# Patient Record
Sex: Male | Born: 1980 | Race: White | Hispanic: No | Marital: Single | Smoking: Former smoker
Health system: Southern US, Community
[De-identification: ages and names within clinical notes are randomized; demographics above are authoritative.]

## PROBLEM LIST (undated history)

## (undated) DIAGNOSIS — E785 Hyperlipidemia, unspecified: Secondary | ICD-10-CM

## (undated) DIAGNOSIS — R7303 Prediabetes: Secondary | ICD-10-CM

## (undated) HISTORY — DX: Hyperlipidemia, unspecified: E78.5

## (undated) HISTORY — PX: TONSILLECTOMY: SUR1361

## (undated) HISTORY — PX: APPENDECTOMY: SHX54

## (undated) HISTORY — DX: Prediabetes: R73.03

---

## 2002-07-23 ENCOUNTER — Ambulatory Visit (HOSPITAL_COMMUNITY): Admission: RE | Admit: 2002-07-23 | Discharge: 2002-07-23 | Payer: Self-pay | Admitting: Pulmonary Disease

## 2002-07-30 ENCOUNTER — Encounter (HOSPITAL_COMMUNITY): Admission: RE | Admit: 2002-07-30 | Discharge: 2002-08-29 | Payer: Self-pay | Admitting: Pulmonary Disease

## 2002-09-26 ENCOUNTER — Emergency Department (HOSPITAL_COMMUNITY): Admission: EM | Admit: 2002-09-26 | Discharge: 2002-09-26 | Payer: Self-pay | Admitting: Emergency Medicine

## 2003-09-14 ENCOUNTER — Observation Stay (HOSPITAL_COMMUNITY): Admission: EM | Admit: 2003-09-14 | Discharge: 2003-09-15 | Payer: Self-pay | Admitting: Emergency Medicine

## 2004-08-18 ENCOUNTER — Emergency Department (HOSPITAL_COMMUNITY): Admission: EM | Admit: 2004-08-18 | Discharge: 2004-08-18 | Payer: Self-pay | Admitting: Emergency Medicine

## 2005-10-21 ENCOUNTER — Ambulatory Visit: Payer: Self-pay | Admitting: Family Medicine

## 2005-12-03 ENCOUNTER — Encounter (INDEPENDENT_AMBULATORY_CARE_PROVIDER_SITE_OTHER): Payer: Self-pay | Admitting: Family Medicine

## 2006-06-02 ENCOUNTER — Ambulatory Visit: Payer: Self-pay | Admitting: Family Medicine

## 2007-02-22 ENCOUNTER — Ambulatory Visit: Payer: Self-pay | Admitting: Family Medicine

## 2008-09-20 ENCOUNTER — Ambulatory Visit: Payer: Self-pay | Admitting: Family Medicine

## 2008-09-20 DIAGNOSIS — M25519 Pain in unspecified shoulder: Secondary | ICD-10-CM | POA: Insufficient documentation

## 2008-09-20 DIAGNOSIS — E663 Overweight: Secondary | ICD-10-CM | POA: Insufficient documentation

## 2008-09-20 DIAGNOSIS — F17200 Nicotine dependence, unspecified, uncomplicated: Secondary | ICD-10-CM | POA: Insufficient documentation

## 2008-09-21 ENCOUNTER — Encounter (INDEPENDENT_AMBULATORY_CARE_PROVIDER_SITE_OTHER): Payer: Self-pay | Admitting: Family Medicine

## 2008-09-23 ENCOUNTER — Encounter (INDEPENDENT_AMBULATORY_CARE_PROVIDER_SITE_OTHER): Payer: Self-pay | Admitting: Family Medicine

## 2008-09-23 LAB — CONVERTED CEMR LAB
Cholesterol: 186 mg/dL (ref 0–200)
Glucose, Bld: 80 mg/dL (ref 70–99)

## 2008-09-30 ENCOUNTER — Encounter (INDEPENDENT_AMBULATORY_CARE_PROVIDER_SITE_OTHER): Payer: Self-pay | Admitting: Family Medicine

## 2009-03-13 ENCOUNTER — Ambulatory Visit: Payer: Self-pay | Admitting: Family Medicine

## 2009-03-13 DIAGNOSIS — I498 Other specified cardiac arrhythmias: Secondary | ICD-10-CM | POA: Insufficient documentation

## 2009-03-13 DIAGNOSIS — R609 Edema, unspecified: Secondary | ICD-10-CM | POA: Insufficient documentation

## 2009-03-13 DIAGNOSIS — R42 Dizziness and giddiness: Secondary | ICD-10-CM | POA: Insufficient documentation

## 2009-03-14 ENCOUNTER — Encounter (INDEPENDENT_AMBULATORY_CARE_PROVIDER_SITE_OTHER): Payer: Self-pay | Admitting: Family Medicine

## 2009-03-14 ENCOUNTER — Telehealth (INDEPENDENT_AMBULATORY_CARE_PROVIDER_SITE_OTHER): Payer: Self-pay | Admitting: *Deleted

## 2009-03-14 ENCOUNTER — Ambulatory Visit: Payer: Self-pay | Admitting: Cardiology

## 2009-03-14 ENCOUNTER — Ambulatory Visit (HOSPITAL_COMMUNITY): Admission: RE | Admit: 2009-03-14 | Discharge: 2009-03-14 | Payer: Self-pay | Admitting: Family Medicine

## 2009-03-14 LAB — CONVERTED CEMR LAB
ALT: 53 units/L (ref 0–53)
AST: 35 units/L (ref 0–37)
Albumin: 5 g/dL (ref 3.5–5.2)
Alkaline Phosphatase: 70 units/L (ref 39–117)
BUN: 19 mg/dL (ref 6–23)
Basophils Absolute: 0 10*3/uL (ref 0.0–0.1)
Basophils Relative: 0 % (ref 0–1)
CO2: 27 meq/L (ref 19–32)
Calcium: 10.5 mg/dL (ref 8.4–10.5)
Chloride: 101 meq/L (ref 96–112)
Creatinine, Ser: 1.12 mg/dL (ref 0.40–1.50)
Eosinophils Absolute: 0.2 10*3/uL (ref 0.0–0.7)
Eosinophils Relative: 2 % (ref 0–5)
Glucose, Bld: 148 mg/dL — ABNORMAL HIGH (ref 70–99)
HCT: 47.3 % (ref 39.0–52.0)
Hemoglobin: 16.6 g/dL (ref 13.0–17.0)
Lymphocytes Relative: 28 % (ref 12–46)
Lymphs Abs: 2.3 10*3/uL (ref 0.7–4.0)
MCHC: 35.1 g/dL (ref 30.0–36.0)
MCV: 89.9 fL (ref 78.0–100.0)
Monocytes Absolute: 0.5 10*3/uL (ref 0.1–1.0)
Monocytes Relative: 6 % (ref 3–12)
Neutro Abs: 5.3 10*3/uL (ref 1.7–7.7)
Neutrophils Relative %: 64 % (ref 43–77)
Platelets: 167 10*3/uL (ref 150–400)
Potassium: 5 meq/L (ref 3.5–5.3)
RBC: 5.26 M/uL (ref 4.22–5.81)
RDW: 12.8 % (ref 11.5–15.5)
Sodium: 140 meq/L (ref 135–145)
TSH: 0.927 microintl units/mL (ref 0.350–4.500)
Total Bilirubin: 0.6 mg/dL (ref 0.3–1.2)
Total Protein: 8.2 g/dL (ref 6.0–8.3)
WBC: 8.3 10*3/uL (ref 4.0–10.5)

## 2009-04-11 ENCOUNTER — Ambulatory Visit: Payer: Self-pay | Admitting: Family Medicine

## 2009-05-03 ENCOUNTER — Encounter (INDEPENDENT_AMBULATORY_CARE_PROVIDER_SITE_OTHER): Payer: Self-pay | Admitting: Family Medicine

## 2010-10-02 ENCOUNTER — Ambulatory Visit
Admission: RE | Admit: 2010-10-02 | Discharge: 2010-10-02 | Payer: Self-pay | Source: Home / Self Care | Attending: Family Medicine | Admitting: Family Medicine

## 2010-10-02 DIAGNOSIS — H6120 Impacted cerumen, unspecified ear: Secondary | ICD-10-CM | POA: Insufficient documentation

## 2010-10-02 DIAGNOSIS — J019 Acute sinusitis, unspecified: Secondary | ICD-10-CM | POA: Insufficient documentation

## 2010-10-03 ENCOUNTER — Encounter: Payer: Self-pay | Admitting: Family Medicine

## 2010-10-05 DIAGNOSIS — R7301 Impaired fasting glucose: Secondary | ICD-10-CM | POA: Insufficient documentation

## 2010-10-05 LAB — CONVERTED CEMR LAB
BUN: 22 mg/dL (ref 6–23)
Basophils Absolute: 0 10*3/uL (ref 0.0–0.1)
Basophils Relative: 0 % (ref 0–1)
CO2: 28 meq/L (ref 19–32)
Calcium: 9.7 mg/dL (ref 8.4–10.5)
Chloride: 103 meq/L (ref 96–112)
Cholesterol: 198 mg/dL (ref 0–200)
Creatinine, Ser: 1 mg/dL (ref 0.40–1.50)
Eosinophils Absolute: 0.1 10*3/uL (ref 0.0–0.7)
Eosinophils Relative: 2 % (ref 0–5)
Glucose, Bld: 94 mg/dL (ref 70–99)
HCT: 46.3 % (ref 39.0–52.0)
HDL: 38 mg/dL — ABNORMAL LOW (ref >39)
Hemoglobin: 15.3 g/dL (ref 13.0–17.0)
LDL Cholesterol: 133 mg/dL — ABNORMAL HIGH (ref 0–99)
Lymphocytes Relative: 28 % (ref 12–46)
Lymphs Abs: 2.3 10*3/uL (ref 0.7–4.0)
MCHC: 33 g/dL (ref 30.0–36.0)
MCV: 92.2 fL (ref 78.0–100.0)
Monocytes Absolute: 0.6 10*3/uL (ref 0.1–1.0)
Monocytes Relative: 8 % (ref 3–12)
Neutro Abs: 5 10*3/uL (ref 1.7–7.7)
Neutrophils Relative %: 62 % (ref 43–77)
Platelets: 186 10*3/uL (ref 150–400)
Potassium: 4.2 meq/L (ref 3.5–5.3)
RBC: 5.02 M/uL (ref 4.22–5.81)
RDW: 12.9 % (ref 11.5–15.5)
Sodium: 139 meq/L (ref 135–145)
TSH: 1.301 microintl units/mL (ref 0.350–4.500)
Total CHOL/HDL Ratio: 5.2
Triglycerides: 133 mg/dL (ref <150)
VLDL: 27 mg/dL (ref 0–40)
WBC: 8 10*3/uL (ref 4.0–10.5)

## 2010-10-13 NOTE — Assessment & Plan Note (Signed)
Summary: JOINT PAIN/LEGS SWELLING/SLJ   Vital Signs:  Patient profile:   30 year old male Height:      68 inches Weight:      193 pounds O2 Sat:      97 % Temp:     97.3 degrees F oral Pulse rate:   52 / minute Pulse (ortho):   74 / minute Resp:     16 per minute BP sitting:   131 / 81  (left arm) BP standing:   144 / 79 Cuff size:   regular  Vitals Entered By: Carlye Grippe (March 13, 2009 3:02 PM)  Serial Vital Signs/Assessments:  Time      Position  BP       Pulse  Resp  Temp     By 4:47 PM   Lying LA  131/77   52                    Carlye Grippe 4:47 PM   Sitting   138/73   67                    Carlye Grippe 4:47 PM   Standing  144/79   74                    Carlye Grippe  CC: c/o leg swelling,multiple joint pain   Primary Provider:  Franchot Heidelberg, MD  CC:  c/o leg swelling and multiple joint pain.  History of Present Illness: Pt in for acute visit.  Ge states he got home Monday night at 10 pm. He states he pulled his pants up to take boots off and ankles were severely swollen. States on Tueday woke up and swelling was down but still there. He worked all day Tuesday and legs stayed swollen. Better than Monday and now almost completely gone. 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 He states was in gthe mid 90s ourdoors. He sweat  ton and he adds drank a lot of water as well. Does diet drinks mostly. He denies chest pain, SOB, and notes he has had some palpitations when he works hard. Lasts until he cools off. He states he has had a little dizzyness as when bending over and states can happen with just squatting down.  States has not had cough or wheeze. He denies early fam hx of CHF and sudden cardiac death. States grandfather had valve replaced when he was in his late 6s.  He states he also has shoulder pain, hand pain and states hurts. He states he has to hold onto two levers for 14 hours, move back and forth and states vibrates a ton. He  notes he usually oes not do this type of work and thinks as cause.  He has not self treated - adds tried two Aleve yesterday and helped pain. Rates pain as 8/10. States shulder on left will pop.   He now presents.  Current Problems (verified): 1)  Overweight  (ICD-278.02) 2)  Shoulder Pain  (ICD-719.41) 3)  Tobacco Abuse  (ICD-305.1)  Current Medications (verified): 1)  None  Allergies (verified): No Known Drug Allergies  Past History:  Past Medical History: Last updated: 09/20/2008 Current Problems:  TOBACCO ABUSE (ICD-305.1)  Past Surgical History: Last updated: 09/20/2008 Wisdome teeth Adenoids Appendix  Family History: Last updated: 09/20/2008 Father: 27 DM, HTN Mother: 74 - Healthy Siblings: None  Social History: Last updated: 09/20/2008 Single  Smoker 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  Risk Factors: Smoking Status: current (09/20/2008) Packs/Day: 1 pack every 3 days (09/20/2008)  Review of Systems      See HPI  Physical Exam  General:  Well-developed,well-nourished,in no acute distress; alert,appropriate and cooperative throughout examination. Very tanned from being in sun. Admits to not wearing sunscreen or protetctive clothing. Lungs:  Normal respiratory effort, chest expands symmetrically. Lungs are clear to auscultation, no crackles or wheezes. Heart:  Normal rate and regular rhythm. S1 and S2 normal without gallop, murmur, click, rub or other extra sounds. Abdomen:  Bowel sounds positive,abdomen soft and non-tender without masses, organomegaly or hernias noted. Msk:  No deformity or scoliosis noted of thoracic or lumbar spine.   He has good ROM and states better today as not working. Extremities:  No clubbing, cyanosis, edema, or deformity noted with normal full range of motion of all joints.   Neurologic:  No cranial nerve deficits noted. Station and gait are normal. Plantar reflexes are down-going bilaterally. DTRs  are symmetrical throughout. Sensory, motor and coordinative functions appear intact. Cervical Nodes:  No lymphadenopathy noted Psych:  Cognition and judgment appear intact. Alert and cooperative with normal attention span and concentration. No apparent delusions, illusions, hallucinations   Impression & Recommendations:  Problem # 1:  LEG EDEMA, BILATERAL (ICD-782.3) Discussed. Long hard labor days in excessive heat. Suspect venous insuff related to this and susequent fluid extrvassation. Councelled taking breaks in cool air. I did an EKG due to bradycardia on initial presentation. Unremarkable with HR normal. However, given age, orthostasis by pulse and risk for other causes such as cardiac strain will do 2 D echo and check baseline labs to assess TSH, HGB, body salt and renal function. Await lab and echo and optomize. Update immediately if worsen. Orders: T-Comprehensive Metabolic Panel ([id number]) Echo Referral (Echo) EKG w/ Interpretation (93000) Venipuncture (09811)  Problem # 2:  BRADYCARDIA (ICD-427.89) Discussed. Reassured about EKG. Await labs and Echo. Orders: T-Comprehensive Metabolic Panel [id number]) T-TSH [id number]) Echo Referral (Echo) EKG w/ Interpretation (93000) Venipuncture (96295)  Problem # 3:  DIZZINESS (ICD-780.4) + for orthostasis based on pulse. Suspect possible dehydration and urged fluid push with gatoraide. If does this and dizzyness persist, will need further eval. I gave him a work excuse and advised cam cool activities for next 48 hours to assure he hydrates. Also to afford chance to get 2 D echo. Orders: T-Comprehensive Metabolic Panel [id number]) T-CBC w/Diff ([id number]) T-TSH [id number]) Echo Referral (Echo) EKG w/ Interpretation (93000) Venipuncture (38756)  Problem # 4:  SHOULDER PAIN (ICD-719.41) Suspect strain associated with heavy labor as cuase for likley bursitus, tendonitis and myalgia. Start Tyulenol 500 mg two three times a  day as needed. States dad uses herbal Rx that does wonders and would like to try. Agreeable. Recheck 4 weeks - sooner if needed. Again, avoid excessive rologed arm and hand use.   Patient Instructions: 1)  Please schedule a follow-up appointment in 1 month. Sooner if needed.      EKG  Procedure date:  03/13/2009  Findings:      normal:  HR 72 BPM   Appended Document: JOINT PAIN/LEGS SWELLING/SLJ echo appt scheduled on 03/14/09 @11 :00am at [facility]. patient informed no precert required. order faxed.LA

## 2010-10-13 NOTE — Letter (Signed)
Summary: Demographics  Demographics   Imported By: Micah Flesher, LPN 16/06/9603 54:09:81  _____________________________________________________________________  External Attachment:    Type:   Image     Comment:   External Document

## 2010-10-13 NOTE — Progress Notes (Signed)
Summary: echo results  Phone Note Call from Patient   Summary of Call: mother called in and wanted to know if we would have results of the echo today, just so they don't have to wait over a long weekend.  she also gave me his new cell phone number i have added it to centricity. thanks Initial call taken by: Curtis Sites,  March 14, 2009 9:10 AM  Follow-up for Phone Call        informed patient via voicemail that we'll call him with results when we get them today.  Follow-up by: Carlye Grippe,  March 14, 2009 9:33 AM

## 2010-10-13 NOTE — Letter (Signed)
Summary: Labs  Labs   Imported By: Micah Flesher, LPN 04/54/0981 19:14:78  _____________________________________________________________________  External Attachment:    Type:   Image     Comment:   External Document

## 2010-10-13 NOTE — Assessment & Plan Note (Signed)
Summary: physical/for work/slj   Vital Signs:  Patient Profile:   30 Years Old Male Height:     68 inches Weight:      188 pounds BMI:     28.69 O2 Sat:      99 % Pulse rate:   86 / minute Resp:     14 per minute BP sitting:   130 / 72  Vitals Entered By: Sherilyn Banker (September 20, 2008 1:57 PM)                 PCP:  Franchot Heidelberg, MD  Chief Complaint:  Needs physical .  History of Present Illness: Pt in for annual physical.  He can get discount on insurance if this is done and results are reviewed.   He now presents.    Prior Medications Reviewed Using: Patient Recall  Updated Prior Medication List: No Medications Current Allergies (reviewed today): No known allergies   Past Medical History:    Reviewed history and no changes required:       Current Problems:        TOBACCO ABUSE (ICD-305.1)         Past Surgical History:    Reviewed history and no changes required:       Wisdome teeth       Adenoids       Appendix   Family History:    Reviewed history from 02/22/2007 and no changes required:       Father: 6 DM, HTN       Mother: 107 - Healthy       Siblings: None  Social History:    Reviewed history from 02/22/2007 and no changes required:       Single       Smoker       ETOH - episodic       Job - Works as a Arboriculturist for [employer].   Risk Factors:  Tobacco use:  current    Year started:  Age  - 49    Cigarettes:  Yes -- 1 pack every 3 days pack(s) per day  Family History Risk Factors:    Family History of MI in females < 43 years old:  no    Family History of MI in males < 28 years old:  no   Review of Systems      See HPI  General      Denies chills, fever, and sweats.      Had some oreos and soda for lunch.  Eyes      Denies blurring, double vision, and itching.  ENT      Denies earache, ringing in ears, sinus pressure, and sore throat.  CV      Denies chest pain or discomfort, swelling of feet, swelling  of hands, and weight gain.  Resp      Denies cough, shortness of breath, sputum productive, and wheezing.  GI      Denies abdominal pain, constipation, diarrhea, nausea, and vomiting.  GU      Denies nocturia, urinary frequency, and urinary hesitancy.  MS      Denies joint pain, loss of strength, and thoracic pain.      He states he hunts a lot. He cannot shoot a regular bow and arrow. States needs permit to do so.  He states he has RTC problems related to his hard physical job and states hard to do heavy lifting and very hard to do bow.  He states he uses Aleve and Advil for this and it helps but adds does not want to mess up stomach with this to hunt. He loves hunting. Tried to bow hunt two years in row and cannot do it.   Neuro      Denies brief paralysis, falling down, numbness, tingling, and tremors.  Psych      Denies anxiety and depression.  Endo      Denies cold intolerance, excessive hunger, excessive thirst, excessive urination, heat intolerance, polyuria, and weight change.   Physical Exam  General:     Well-developed,well-nourished,in no acute distress; alert,appropriate and cooperative throughout examination.Wears baseball cap and hunting boots. Head:     Normocephalic and atraumatic without obvious abnormalities. No apparent alopecia or balding. Eyes:     No corneal or conjunctival inflammation noted. EOMI. Perrla.  Ears:     External ear exam shows no significant lesions or deformities.   Nose:     External nasal examination shows no deformity or inflammation. Nasal mucosa are pink and moist without lesions or exudates. Mouth:     Oral mucosa and oropharynx without lesions or exudates.  Teeth in good repair. Neck:     No deformities, masses, or tenderness noted. Lungs:     Normal respiratory effort, chest expands symmetrically. Lungs are clear to auscultation, no crackles or wheezes. Heart:     Normal rate and regular rhythm. S1 and S2 normal without gallop,  murmur, click, rub or other extra sounds. Abdomen:     Bowel sounds positive,abdomen soft and non-tender without masses, organomegaly or hernias noted. Extremities:     MIld pain on ROM in right shoulder. He has good strength but notes with repetative motion worsens.  Neurologic:     No cranial nerve deficits noted. Station and gait are normal. Plantar reflexes are down-going bilaterally. DTRs are symmetrical throughout. Sensory, motor and coordinative functions appear intact. Cervical Nodes:     No lymphadenopathy noted Psych:     Cognition and judgment appear intact. Alert and cooperative with normal attention span and concentration. No apparent delusions, illusions, hallucinations    Impression & Recommendations:  Problem # 1:  WELL ADULT EXAM (ICD-V70.0) Exam complete. Get TC and BS as per form requested by insurance. States not to do anything else per patient as insurance will not cover. Councelled diet, exersize and yearly eye and dental care. Agrees. Will fax insurance form completed back on Monday once we get lab back.  Agrees. Orders: T- * Misc. Laboratory test [phone])   Problem # 2:  TOBACCO ABUSE (ICD-305.1) Councelled cessation. Notes mom gets onto him for this. he smokes a pack every 3 days and was councelled on need to quit. He is not open to Rx and will try on own. Follow. Aware of cancer and death risk.  Problem # 3:  SHOULDER PAIN, RIGHT (ICD-719.41) Agree with hunting permit. Form completed. Advised ROM exersizes and strength training. If this worsens, will need formal work up with shoulder films and possible Ortho eval.   Problem # 4:  OVERWEIGHT (ICD-278.02) BMI of 28.69. Councelled diet, exersize and weight loss. Aware and working on this.   Patient Instructions: 1)  Please schedule a follow-up appointment in 3 months.

## 2010-10-13 NOTE — Letter (Signed)
Summary: Medical Physical Voucher  Medical Physical Voucher   Imported By: Lutricia Horsfall 09/30/2008 13:42:11  _____________________________________________________________________  External Attachment:    Type:   Image     Comment:   External Document

## 2010-10-13 NOTE — Letter (Signed)
Summary: Records Release  Records Release   Imported By: Micah Flesher, LPN 16/06/9603 54:09:81  _____________________________________________________________________  External Attachment:    Type:   Image     Comment:   External Document

## 2010-10-13 NOTE — Letter (Signed)
Summary: Progress Note  Progress Note   Imported By: Micah Flesher, LPN 29/56/2130 86:57:84  _____________________________________________________________________  External Attachment:    Type:   Image     Comment:   External Document

## 2010-10-13 NOTE — Letter (Signed)
Summary: H & P  H & P   Imported By: Micah Flesher, LPN 52/84/1324 40:10:27  _____________________________________________________________________  External Attachment:    Type:   Image     Comment:   External Document

## 2010-10-13 NOTE — Assessment & Plan Note (Signed)
Summary: 4 WEEK FOLLOW UP/ARC   Vital Signs:  Patient profile:   30 year old male Height:      68 inches Weight:      195 pounds O2 Sat:      96 % Temp:     98.0 degrees F oral Pulse rate:   76 / minute BP sitting:   135 / 84  (left arm) Cuff size:   regular  Vitals Entered By: Carlye Grippe (April 11, 2009 2:56 PM) CC: f/u leg swelling   Primary Provider:  Franchot Heidelberg, MD  CC:  f/u leg swelling.  History of Present Illness: Pt in for recheck.  He states he feels a wole lot better. He states his leg swelling has improved dramatically. He denies chest pain, SOB, palpitations. He states he remains very active and now sits in heavy equipment.Previously was doing 14 hours day in hot summer heat.   1. CBC - normal 2. CMP - normal 3. TSH - normal  2D echo:       1. Left ventricle: The cavity size was normal. Wall thickness was        increased in a pattern of mild LVH. Systolic function was normal.        The estimated ejection fraction was in the range of 55% to 60%.        Wall motion was normal; there were no regional wall motion        abnormalities.     2. Mitral valve: Trivial regurgitation.     3. Tricuspid valve: Trivial regurgitation.     4. Pericardium, extracardiac: There was no pericardial effusion.  He adds he started a herbal supplement called OPC 3 - father uses same and it has helped lot.  Statesless achy and hurting with manual work.   He now presents.   Current Medications (verified): 1)  None  Allergies (verified): No Known Drug Allergies  Past History:  Past Surgical History: Wisdom teeth Adenoids Appendix  Review of Systems      See HPI General:  Denies chills, fever, and sweats. Resp:  Denies cough, shortness of breath, sputum productive, and wheezing. GI:  Denies abdominal pain, constipation, diarrhea, nausea, and vomiting. GU:  Denies nocturia, urinary frequency, and urinary hesitancy. Neuro:  Denies headaches, numbness,  tingling, tremors, and weakness.  Physical Exam  General:  Well-developed,well-nourished,in no acute distress; alert,appropriate and cooperative throughout examination Lungs:  Normal respiratory effort, chest expands symmetrically. Lungs are clear to auscultation, no crackles or wheezes. Heart:  Normal rate and regular rhythm. S1 and S2 normal without gallop, murmur, click, rub or other extra sounds. Abdomen:  Bowel sounds positive,abdomen soft and non-tender without masses, organomegaly or hernias noted. Extremities:  No clubbing, cyanosis, edema, or deformity noted with normal full range of motion of all joints.   Cervical Nodes:  No lymphadenopathy noted Psych:  Cognition and judgment appear intact. Alert and cooperative with normal attention span and concentration. No apparent delusions, illusions, hallucinations   Impression & Recommendations:  Problem # 1:  LEG EDEMA, BILATERAL (ICD-782.3) Resolved. Suspect overheating, extensive time in son and lack of adequate hydration. Councelledavoidnce of excessive daytime heat. Use gatoraide, powerade to replace any lost body salts with prolonged time outside. Mil LVH per Echo. Advised periodice bP checks with goal of 140/90 or less. Reassured about trivial valvular regurg.   Problem # 2:  DIZZINESS (ICD-780.4) Resolved. See above.  Problem # 3:  BRADYCARDIA (ICD-427.89) Resolved. See above.  Problem # 4:  TOBACCO ABUSE (ICD-305.1) Smokes pack every 3 days. Urged to quit. Advised health risk. Aware. Will work on this himself.  Patient Instructions: 1)  Please schedule a follow-up appointment in 1 month.

## 2010-10-13 NOTE — Assessment & Plan Note (Signed)
Summary: Redness to ankle and foot   Vital Signs:  Patient Profile:   30 Years Old Male Weight:      205 pounds Pulse rate:   81 / minute Resp:     16 per minute BP sitting:   133 / 64  (left arm)  Pt. in pain?   no  Vitals Entered BySonny Dandy (February 22, 2007 11:10 AM)                PCP:  Franchot Heidelberg, MD  Chief Complaint:  left foot rash .  History of Present Illness: Pt in for acute visit.   He went to [place] this weekend to do 4-wheeling. He was outdoors and got into mud. Noticed on arrival home that he had marks over left ankle, dorsum left foot and right foot to lesser degrees. These were red, itchy and he had several friends who found same. Cleaned clothing and euipment fearing insect bites. Has been scratching this weekend and now area over left foot dorsum opne, red with halo of redness in star fashion round it. Warm to touch. Worried about infection. Mom not with him but advised he be seen.  No fever, chills or sweats. Note travel as above and friends with same. Does not use DEET. Aware he probably should.No new lotions, body products. No hx of contact dermatites.  Now presents.  Current Allergies: No known allergies    Family History:    Father: 27s DM    Mother: Healthy    Siblings: None  Social History:    Single    Never Smoked   Risk Factors:  Tobacco use:  never   Review of Systems      See HPI   Physical Exam  General:     Well-developed,well-nourished,in no acute distress; alert,appropriate and cooperative throughout examination Lungs:     Normal respiratory effort, chest expands symmetrically. Lungs are clear to auscultation, no crackles or wheezes. Heart:     Normal rate and regular rhythm. S1 and S2 normal without gallop, murmur, click, rub or other extra sounds. Abdomen:     Bowel sounds positive,abdomen soft and non-tender without masses, organomegaly or hernias noted. Extremities:     No clubbing, cyanosis,  edema, or deformity noted with normal full range of motion of all joints.   Skin:     Left ankle sjhowsmultiple red, non blanching marks measuring few mm to as large as 5 mm over medial ankle. No warmth. Minimal black eschar over some. Large lesion left foot dorsum - dime size. Scabbed. No puss. Eryhtema surrounding with increased temp. Some lesions similar on right ankle with some streak like areas on bottom of foot - punctate lesions. Appears as track line almost as if something bit him and did not get good response,then moved to next site. Psych:     Cognition and judgment appear intact. Alert and cooperative with normal attention span and concentration. No apparent delusions, illusions, hallucinations    Impression & Recommendations:  Problem # 1:  CELLULITIS/ABSCESS, FOOT (ICD-682.7) Discussed. Councelled diagnosis. Start Doxy. Risk and benefit of med outlined. If any side-effects update. Monitor for spread. Sx should improve in 24 to 48 hours - if notupdate. His updated medication list for this problem includes:    Doxy-caps 100 Mg Caps (Doxycycline hyclate) .Marland Kitchen..Marland Kitchen Two times a day   Problem # 2:  INSECT BITE, FOOT, INFECTED (ICD-917.5) Discussed prevention. Start DEET containing spray when outdoors - Off is could brand. Avoid  peak insect hours i.e. dusk and dawn. May use Benadryl Lotion or tripple bx ointment for remainder of lesions to improve sx. If any concerns, follow-up immediately.  Medications Added to Medication List This Visit: 1)  Doxy-caps 100 Mg Caps (Doxycycline hyclate) .... Two times a day

## 2010-10-13 NOTE — Letter (Signed)
Summary: Email from patients mother  EMail from patients mother   Imported By: Lutricia Horsfall 09/30/2008 15:52:07  _____________________________________________________________________  External Attachment:    Type:   Image     Comment:   External Document

## 2010-10-13 NOTE — Letter (Signed)
Summary: Vitals  Vitals   Imported By: Micah Flesher, LPN 48/54/6270 35:00:93  _____________________________________________________________________  External Attachment:    Type:   Image     Comment:   External Document

## 2010-10-13 NOTE — Letter (Signed)
Summary: Out of Work  [facility]  [address]   [place], [place] [address]   Phone: [phone]  Fax: [phone]    March 13, 2009   Employee:  Eliazer A Schorsch    To Whom It May Concern:   For Medical reasons, please excuse the above named employee from work for the following dates:  Start:   03/13/09  End:   03/14/09  May return 03/15/09  If you need additional information, please feel free to contact our office.         Sincerely,       Franchot Heidelberg MD

## 2010-10-13 NOTE — Letter (Signed)
Summary: cross bow permit  cross bow permit   Imported By: Curtis Sites 09/23/2008 14:26:38  _____________________________________________________________________  External Attachment:    Type:   Image     Comment:   External Document

## 2010-10-21 NOTE — Assessment & Plan Note (Signed)
Summary: per dr. simpson/sick   Vital Signs:  Patient profile:   30 year old male Height:      68 inches Weight:      191.50 pounds BMI:     29.22 O2 Sat:      96 % Pulse rate:   78 / minute Pulse rhythm:   regular Resp:     16 per minute BP sitting:   114 / 78  (left arm) Cuff size:   regular  Vitals Entered By: Everitt Amber LPN (October 02, 2010 9:01 AM)  Nutrition Counseling: Patient's BMI is greater than 25 and therefore counseled on weight management options. CC: New patient, head congestion, ears have been popping, sinuses congestion, coughing up some yellowish colored phlegm for the past few weeks, seems to be geting better   Primary Care Provider:  Kerri Perches MD  CC:  New patient, head congestion, ears have been popping, sinuses congestion, coughing up some yellowish colored phlegm for the past few weeks, and seems to be geting better.  History of Present Illness: 3 week h/o head and chest congestion, green nasal drainage , bilateral ear pressure, up to this week he had green sinus drainage.  He has had increased allergy symptoms Reports  that prior to this he had been  doing well. He continues to smoke cigaretttes, and is considering quitting. He does not exercise regulalrly Denies chest pain, palpitations, PND, orthopnea or leg swelling. Denies abdominal pain, nausea, vomitting, diarrhea or constipation. Denies change in bowel movements or bloody stool. Denies dysuria , frequency, incontinence or hesitancy. Denies  joint pain, swelling, or reduced mobility. Denies headaches, vertigo, seizures. Denies depression, anxiety or insomnia. Denies  rash, lesions, or itch.     Preventive Screening-Counseling & Management  Alcohol-Tobacco     Smoking Status: current     Smoking Cessation Counseling: yes     Packs/Day: 0.5  Comments: trying to quit   Current Medications (verified): 1)  None  Allergies (verified): No Known Drug Allergies  Past  History:  Past Medical History: Last updated: 09/20/2008 Current Problems:  TOBACCO ABUSE (ICD-305.1)  Past Surgical History: Last updated: 04/11/2009 Wisdom teeth Adenoids Appendix  Family History: Last updated: 09/20/2008 Father: 55 DM, HTN Mother: 8 - Healthy Siblings: None  Social History: Last updated: 10/02/2010 Single Smoker, 1 PPD, primarily at work 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 Endo:  Denies cold intolerance, excessive hunger, excessive thirst, excessive urination, and heat intolerance. Heme:  Denies abnormal bruising and bleeding. Allergy:  Complains of seasonal allergies and sneezing; denies hives or rash and itching eyes.  Physical Exam  General:  Well-developed,well-nourished,in no acute distress; alert,appropriate and cooperative throughout examination HEENT: No facial asymmetry,  EOMI, maxillary  sinus tenderness, right TM Clear, left TM impacted with cerumen,oropharynx  pink and moist.   Chest: Clear to auscultation bilaterally. decreased  CVS: S1, S2, No murmurs, No S3.   Abd: Soft, Nontender.  MS: Adequate ROM spine, hips, shoulders and knees.  Ext: No edema.   CNS: CN 2-12 intact, power tone and sensation normal throughout.   Skin: Intact, no visible lesions or rashes.  Psych: Good eye contact, normal affect.  Memory intact, not anxious or depressed appearing.    Impression & Recommendations:  Problem # 1:  CERUMEN IMPACTION, LEFT (ICD-380.4) Assessment Comment Only counselled re use of wax softener only  Problem # 2:  ACUTE SINUSITIS, UNSPECIFIED (ICD-461.9) Assessment: Comment  Only  His updated medication list for this problem includes:    Septra Ds 800-160 Mg Tabs (Sulfamethoxazole-trimethoprim) .Marland Kitchen... Take 1 tablet by mouth two times a day  Problem # 3:  OVERWEIGHT (ICD-278.02) Assessment: Improved  Ht: 68 (10/02/2010)   Wt: 191.50 (10/02/2010)   BMI: 29.22 (10/02/2010) therapeutic lifestyle change discussed and encouraged  Problem # 4:  TOBACCO ABUSE (ICD-305.1) Assessment: Unchanged  Encouraged smoking cessation and discussed different methods for smoking cessation.   Complete Medication List: 1)  Septra Ds 800-160 Mg Tabs (Sulfamethoxazole-trimethoprim) .... Take 1 tablet by mouth two times a day  Other Orders: T-Basic Metabolic Panel [phone]) T-Lipid Profile [phone]) T-CBC w/Diff [phone]) T-TSH [phone])  Patient Instructions: 1)  F/U as needed, in 1 year for routine care. 2)  You are being treated for sinusitis ,and ear infection. 3)  It is vital that you take all of your antibiotics prescribed for the entire 3 weeks. 4)  OK to use sudafed up to 2 tabs daily for the next 3 days to reduce the pressure in your ears. 5)  PLS DO wash your nostrils and sinuses with salt water 3 to 4 times daily for the next 5 to 7 days, this will help with the sinus cleanising. OK to use home made solution or buy OTC "ocean spray" 6)  You have some wax build up in your left ear. PLs use OTC wax softener as directed, and just clean the wax from the outer ear, no qtips in the ear pls 7)  Tobacco is very bad for your health and your loved ones! You Should stop smoking!. 8)  Stop Smoking Tips: Choose a Quit date. Cut down before the Quit date. decide what you will do as a substitute when you feel the urge to smoke(gum,toothpick,exercise). 9)  It is important that you exercise regularly at least 30 minutes 5 times a week. If you develop chest pain, have severe difficulty breathing, or feel very tired , stop exercising immediately and seek medical  attention. 10)  BMP prior to visit, ICD-9: 11)  Lipid Panel prior to visit, ICD-9: 12)  TSH prior to visit, ICD-9:   fasting asap 13)  CBC w/ Diff prior to visit, ICD-9: Prescriptions: SEPTRA DS 800-160 MG TABS (SULFAMETHOXAZOLE-TRIMETHOPRIM) Take 1 tablet by mouth two times a day  #42 x 0   Entered and Authorized by:   Syliva Overman MD   Signed by:   Syliva Overman MD on 10/02/2010   Method used:   Electronically to        [facility] (retail)       [address]       [place], [place]  [address]       Ph: [phone] or [phone]       Fax: [phone]   RxID:   [id number]    Orders Added: 1)  New Patient Level III [99203] 2)  T-Basic Metabolic Panel [phone] 3)  T-Lipid Profile [[id number]] 4)  T-CBC w/Diff [[id number]] 5)  T-TSH [[id number]]

## 2011-01-29 NOTE — Op Note (Signed)
NAME:  Isaac Hubbard, Isaac Hubbard                               ACCOUNT NO.:  [id number]   MEDICAL RECORD NO.:  [id number]                   PATIENT TYPE:  OBV   LOCATION:  A336                                 FACILITY:  [facility]   PHYSICIAN:  Dalia Heading, M.D.               DATE OF BIRTH:  10-14-1980   DATE OF PROCEDURE:  09/14/2003  DATE OF DISCHARGE:                                 OPERATIVE REPORT   PREOPERATIVE DIAGNOSIS:  Acute appendicitis.   POSTOPERATIVE DIAGNOSIS:  Acute appendicitis.   PROCEDURE:  Laparoscopic appendectomy.   SURGEON:  Dalia Heading, M.D.   ANESTHESIA:  General endotracheal.   INDICATIONS:  The patient is Hubbard 30 year old white male who presents with  acute appendicitis.  The risks and benefits of the procedure including  bleeding, infection, and the possibility of an open procedure were fully  explained to the patient, gaining informed consent.   DESCRIPTION OF PROCEDURE:  The patient was placed in the Trendelenburg  position after induction of general endotracheal anesthesia.  The abdomen  was prepped and draped using the usual sterile technique with Betadine.  Surgical site confirmation was performed.   Hubbard supraumbilical incision was made down to the fascia.  Hubbard Veress needle was  introduced into the abdominal cavity and confirmation of placement was done  using the saline drop test.  The abdomen was then insufflated to 16 mmHg  pressure.  An 11 mm trocar was introduced into the abdominal cavity under  direct visualization without difficulty.  An additional 11 mm trocar was  placed in the suprapubic region and 5 mm trocar was placed in the left lower  quadrant region.  The appendix was visualized and noted to be acutely  inflamed.  There was no evidence of perforation.  The mesoappendix was  divided using the harmonics scalpel.  Hubbard vascular endo-GIA was placed across  the base of the appendix and fired.  The appendix was removed using an endo-  catch bag without  difficulty.  The right lower quadrant was inspected and no  abnormal bleeding was noted.  The staple line was noted to be intact.  All  fluid and air were then evacuated from the abdominal cavity prior to removal  of the trocars.   All wounds were irrigated with normal saline.  All wounds were injected with  0.5% Sensorcaine.  The supraumbilical fascia as well as suprapubic fascia  were reapproximated using an 0 Vicryl interrupted suture.  All skin  incisions were closed using staples.  Betadine ointment and dry sterile  dressings were applied.   All tape and needle counts were correct at the end of the procedure.  The  patient was extubated in the operating room and went back to the recovery  room awake and in stable condition.   COMPLICATIONS:  None.   SPECIMENS:  Appendix.   BLOOD LOSS:  Minimal.  ___________________________________________                                            Dalia Heading, M.D.   MAJ/MEDQ  D:  09/14/2003  T:  09/14/2003  Job:  [id number]   cc:   Ramon Dredge L. Juanetta Gosling, M.D.  [address]  [place]  [place] [address]  Fax: [phone]

## 2011-06-13 ENCOUNTER — Telehealth: Payer: Self-pay | Admitting: Family Medicine

## 2011-06-13 NOTE — Telephone Encounter (Signed)
1 day h/o acute diareah, started early this am, watery and frequent between 3 and 7 am, 2 separate episodes of bright red blood , aprox 2 inch diameter. No nausea or vomiting, stomach feels queasy.With pepto bismol less frequent stool, has tolerated biscuit and ginger ale.  I advised push fluids including gator ade, and the BRAT diet as tolerated, toast banana, apple sauce, if bleeding increases , orr pt continues to c/o weakness or develops fever or chills or increased bleeding then ED, if not use pepto bismol as needed per direction on bottle, and I suggest GI eval with the [facility] notr associated with any straining at stool, wlill re address in am

## 2011-06-14 ENCOUNTER — Telehealth: Payer: Self-pay | Admitting: Family Medicine

## 2011-06-14 ENCOUNTER — Other Ambulatory Visit: Payer: Self-pay

## 2011-06-14 DIAGNOSIS — R197 Diarrhea, unspecified: Secondary | ICD-10-CM

## 2011-06-14 NOTE — Telephone Encounter (Signed)
Spoke with on call GI doc, Fields re bloody diarreah, she recommends, stooll be  sent for c/s and c diff asap,  Hydration, ensure that his urine stays clear, pepto bismol as needed. If things worsen in terms of diarreah, body aches, fever , chills , weakness, call her office she will see him sooner than October 16 at 11:30am which is the time she plans to f/u. She stated he will need a colonoscopy at some time also. I advised pt's mother of this and she will pass info to Kyrel. He is out of work today and reportedly feels better. 2 episodes to my knowledge of [facility]. Containers with requisition for stool will be sent with his mother , Aurea Graff.

## 2011-06-29 ENCOUNTER — Ambulatory Visit: Payer: Self-pay | Admitting: Gastroenterology

## 2011-07-21 ENCOUNTER — Telehealth: Payer: Self-pay | Admitting: Family Medicine

## 2011-07-21 ENCOUNTER — Other Ambulatory Visit: Payer: Self-pay | Admitting: Family Medicine

## 2011-07-21 NOTE — Telephone Encounter (Signed)
sttaes her son, the pt has decided he wants to quit smoking, current approx 1PPD per Mom. Initially asked about chantix, I do not recommend based oon reported inc Cv risk, has decided on nicotine replacement , will erx the desired type patch, and request nurse to specifically let pt know he should not smoke with the patch and that the patch is ordered in tapering doses of nicotine , at the end of the course he will have quit, also to let him know I totally support this decision, it is a GREAT on for his health

## 2011-07-21 NOTE — Telephone Encounter (Signed)
pls see incoming msg on pt from his mother, I have documented your message to him, and ask that you send in the med entered historically after you speak to him

## 2011-07-23 MED ORDER — NICOTINE 14 MG/24HR TD PT24: 1.0000 | MEDICATED_PATCH | TRANSDERMAL | Status: DC

## 2011-07-23 MED ORDER — NICOTINE 7 MG/24HR TD PT24: 1.0000 | MEDICATED_PATCH | TRANSDERMAL | Status: DC

## 2011-07-23 MED ORDER — NICOTINE 21 MG/24HR TD PT24: 1.0000 | MEDICATED_PATCH | TRANSDERMAL | Status: DC

## 2011-07-28 NOTE — Telephone Encounter (Signed)
He is using the patches and having success

## 2011-08-13 ENCOUNTER — Emergency Department (HOSPITAL_COMMUNITY)
Admission: EM | Admit: 2011-08-13 | Discharge: 2011-08-13 | Disposition: A | Discharge status: Home / Self Care | Payer: 59 | Source: Home / Self Care | Attending: Family Medicine | Admitting: Family Medicine

## 2011-08-13 DIAGNOSIS — J069 Acute upper respiratory infection, unspecified: Secondary | ICD-10-CM

## 2011-08-13 MED ORDER — AZITHROMYCIN 250 MG PO TABS: 250.0000 mg | ORAL_TABLET | Freq: Every day | ORAL | Status: AC

## 2011-08-13 MED ORDER — GUAIFENESIN-CODEINE 100-10 MG/5ML PO SYRP: 5.0000 mL | ORAL_SOLUTION | Freq: Four times a day (QID) | ORAL | Status: AC | PRN

## 2011-08-13 MED ORDER — PSEUDOEPHEDRINE-GUAIFENESIN ER 120-1200 MG PO TB12: ORAL_TABLET | ORAL | Status: DC

## 2011-08-13 NOTE — ED Notes (Signed)
C/o sick for 3 days w chills, fever, congestion, dry nasal feeling w minor nose bleed; using OTC meds w minor relief

## 2011-08-13 NOTE — ED Provider Notes (Signed)
History     CSN: [id number] Arrival date & time: 08/13/2011  2:32 PM   First MD Initiated Contact with Patient 08/13/11 1405      Chief Complaint  Patient presents with  . URI    (Consider location/radiation/quality/duration/timing/severity/associated sxs/prior treatment) Patient is a 30 y.o. male presenting with URI. The history is provided by the patient.  URI The primary symptoms include sore throat and cough. Primary symptoms do not include fever or wheezing. Primary symptoms comment: sinus congestion The current episode started 3 to 5 days ago. This is a new problem.  The cough began 3 to 5 days ago. The cough is non-productive.   Symptoms associated with the illness include chills and congestion. The illness is not associated with rhinorrhea.  denies fever. Using nasal spray with some relief  History reviewed. No pertinent past medical history.  History reviewed. No pertinent past surgical history.  History reviewed. No pertinent family history.  History  Substance Use Topics  . Smoking status: Not on file  . Smokeless tobacco: Not on file  . Alcohol Use: No      Review of Systems  Constitutional: Positive for chills. Negative for fever.  HENT: Positive for congestion, sore throat and postnasal drip. Negative for rhinorrhea.   Respiratory: Positive for cough. Negative for apnea, chest tightness, shortness of breath and wheezing.   Cardiovascular: Negative.   Gastrointestinal: Negative.   Genitourinary: Negative.     Allergies  Review of patient's allergies indicates no known allergies.  Home Medications   Current Outpatient Rx  Name Route Sig Dispense Refill  . NICOTINE 14 MG/24HR TD PT24 Transdermal Place 1 patch onto the skin daily. 21 patch 0    Start 08/16/2011 end 09/06/2011  . NICOTINE 21 MG/24HR TD PT24 Transdermal Place 1 patch (21 patches total) onto the skin daily. 28 patch 0    Start 07/26/2011 end 08/16/2011  . NICOTINE 7 MG/24HR TD PT24  Transdermal Place 1 patch onto the skin daily. 28 patch 0    Start 09/06/2011  . AZITHROMYCIN 250 MG PO TABS Oral Take 1 tablet (250 mg total) by mouth daily. Take first 2 tablets together, then 1 every day until finished. 6 tablet 0  . GUAIFENESIN-CODEINE 100-10 MG/5ML PO SYRP Oral Take 5 mLs by mouth every 6 (six) hours as needed for cough. 120 mL 0  . PSEUDOEPHEDRINE-GUAIFENESIN [id number] MG PO TB12  One tab po q 12 hrs 20 each 0    BP 133/97  Pulse 81  Temp(Src) 97.9 F (36.6 C) (Oral)  Resp 16  SpO2 99%  Physical Exam  Nursing note and vitals reviewed. Constitutional: He appears well-developed and well-nourished.  HENT:  Head: Normocephalic and atraumatic.  Right Ear: External ear normal.  Left Ear: External ear normal.  Mouth/Throat: Oropharynx is clear and moist.       Nasal congestion  Neck: Normal range of motion. Neck supple.  Cardiovascular: Normal rate, regular rhythm and normal heart sounds.   Pulmonary/Chest: Effort normal and breath sounds normal. He has no wheezes.  Abdominal: Soft. Bowel sounds are normal.  Skin: Skin is warm and dry.    ED Course  Procedures (including critical care time)  Labs Reviewed - No data to display No results found.   1. URI (upper respiratory infection)       MDM          Randa Spike, MD 08/13/11 [phone]

## 2011-08-13 NOTE — ED Notes (Signed)
Pt. came back and said the work note had the wrong name on it.  Note removed from d/c instructions.  Discussed with Dr. Lorenza Chick. He said he did not do the note the RN did, but pt. can go back to work on Mon. Note redone and given to pt. I told him I was sorry and thanked him for coming back. Vassie Moselle 08/13/2011

## 2011-09-03 ENCOUNTER — Encounter: Payer: Self-pay | Admitting: Family Medicine

## 2011-09-13 ENCOUNTER — Encounter: Payer: Self-pay | Admitting: Family Medicine

## 2011-09-21 ENCOUNTER — Ambulatory Visit (INDEPENDENT_AMBULATORY_CARE_PROVIDER_SITE_OTHER): Payer: Self-pay | Admitting: Family Medicine

## 2011-09-21 ENCOUNTER — Encounter: Payer: Self-pay | Admitting: Family Medicine

## 2011-09-21 VITALS — BP 124/68 | HR 68 | Temp 98.4°F | Resp 18 | Ht 69.5 in | Wt 192.1 lb

## 2011-09-21 DIAGNOSIS — J40 Bronchitis, not specified as acute or chronic: Secondary | ICD-10-CM

## 2011-09-21 DIAGNOSIS — R7301 Impaired fasting glucose: Secondary | ICD-10-CM

## 2011-09-21 DIAGNOSIS — E663 Overweight: Secondary | ICD-10-CM

## 2011-09-21 DIAGNOSIS — J309 Allergic rhinitis, unspecified: Secondary | ICD-10-CM | POA: Insufficient documentation

## 2011-09-21 MED ORDER — AZITHROMYCIN 250 MG PO TABS: ORAL_TABLET | ORAL | Status: AC

## 2011-09-21 MED ORDER — FLUTICASONE PROPIONATE 50 MCG/ACT NA SUSP: 1.0000 | Freq: Every day | NASAL | Status: DC

## 2011-09-21 MED ORDER — PREDNISONE (PAK) 5 MG PO TABS: 5.0000 mg | ORAL_TABLET | ORAL | Status: DC

## 2011-09-21 NOTE — Patient Instructions (Signed)
CPE as before.  It is important that you exercise regularly at least 30 minutes 5 times a week. If you develop chest pain, have severe difficulty breathing, or feel very tired, stop exercising immediately and seek medical attention   A healthy diet is rich in fruit, vegetables and whole grains. Poultry fish, nuts and beans are a healthy choice for protein rather then red meat. A low sodium diet and drinking 64 ounces of water daily is generally recommended. Oils and sweet should be limited. Carbohydrates especially for those who are diabetic or overweight, should be limited to 34-45 gram per meal. It is important to eat on a regular schedule, at least 3 times daily. Snacks should be primarily fruits, vegetables or nuts.  CBC, fasting lipid chem 7 before visit.  Congrats on quitting smoking   You are being treated for uncontrolled allergies and bronchitis

## 2011-09-28 NOTE — Assessment & Plan Note (Signed)
Acute infection, antibiotic and decongestant prescribed 

## 2011-09-28 NOTE — Assessment & Plan Note (Signed)
Uncontrolled, start prescription med and pred dose pack prescribed

## 2011-09-28 NOTE — Assessment & Plan Note (Signed)
Quit , has been using nicoderm patches

## 2011-09-28 NOTE — Progress Notes (Signed)
  Subjective:    Patient ID: Isaac Hubbard, male    DOB: Dec 16, 1980, 31 y.o.   MRN: [id number]  HPI Pt reports 1 week h/o increased sinus congestion with excessive sneezing and watery eyes. He has also excessive chest congestion with cough and thick sputum. He was recently treated for sinusitis , and is concerned that he will have a relapse. He has quit smoking and feels better. He is not currently active, but plans to start some regular physical activity for improved health. He also hopes to lose a few pounds. He reports good quality of sleep, and denies depression or anxiety.   Review of Systems See HPI Denies chest pains, palpitations and leg swelling Denies abdominal pain, nausea, vomiting,diarrhea or constipation.   Denies dysuria, frequency, hesitancy or incontinence. Denies joint pain, swelling and limitation in mobility. Denies headaches, seizures, numbness, or tingling. Denies depression, anxiety or insomnia. Denies skin break down or rash.        Objective:   Physical Exam Patient alert and oriented and in no cardiopulmonary distress.  HEENT: No facial asymmetry, EOMI, no   sinus tenderness,  oropharynx pink and moist.  Neck supple no adenopathy.Erythema and edema of nasal mucosa  Chest: decreased air entry scattered crackles CVS: S1, S2 no murmurs, no S3.  ABD: Soft non tender. Bowel sounds normal.  Ext: No edema  MS: Adequate ROM spine, shoulders, hips and knees.  Skin: Intact, no ulcerations or rash noted.  Psych: Good eye contact, normal affect. Memory intact not anxious or depressed appearing.  CNS: CN 2-12 intact, power, tone and sensation normal throughout.        Assessment & Plan:

## 2011-09-29 LAB — LIPID PANEL
Cholesterol: 195 mg/dL (ref 0–200)
HDL: 46 mg/dL (ref >39)
LDL Cholesterol: 129 mg/dL — ABNORMAL HIGH (ref 0–99)
Total CHOL/HDL Ratio: 4.2 Ratio
Triglycerides: 99 mg/dL (ref <150)
VLDL: 20 mg/dL (ref 0–40)

## 2011-09-29 LAB — COMPREHENSIVE METABOLIC PANEL
ALT: 55 U/L — ABNORMAL HIGH (ref 0–53)
AST: 27 U/L (ref 0–37)
Albumin: 4.5 g/dL (ref 3.5–5.2)
Alkaline Phosphatase: 53 U/L (ref 39–117)
BUN: 16 mg/dL (ref 6–23)
CO2: 29 mEq/L (ref 19–32)
Calcium: 9.9 mg/dL (ref 8.4–10.5)
Chloride: 105 mEq/L (ref 96–112)
Creat: 0.98 mg/dL (ref 0.50–1.35)
Glucose, Bld: 89 mg/dL (ref 70–99)
Potassium: 4.7 mEq/L (ref 3.5–5.3)
Sodium: 141 mEq/L (ref 135–145)
Total Bilirubin: 0.4 mg/dL (ref 0.3–1.2)
Total Protein: 6.9 g/dL (ref 6.0–8.3)

## 2011-09-29 LAB — CBC
HCT: 43.7 % (ref 39.0–52.0)
Hemoglobin: 15 g/dL (ref 13.0–17.0)
MCH: 31.4 pg (ref 26.0–34.0)
MCHC: 34.3 g/dL (ref 30.0–36.0)
MCV: 91.4 fL (ref 78.0–100.0)
Platelets: 197 10*3/uL (ref 150–400)
RBC: 4.78 MIL/uL (ref 4.22–5.81)
RDW: 13 % (ref 11.5–15.5)
WBC: 5.9 10*3/uL (ref 4.0–10.5)

## 2011-10-13 ENCOUNTER — Encounter: Payer: Self-pay | Admitting: Family Medicine

## 2011-10-14 ENCOUNTER — Ambulatory Visit (INDEPENDENT_AMBULATORY_CARE_PROVIDER_SITE_OTHER): Payer: 59 | Admitting: Family Medicine

## 2011-10-14 ENCOUNTER — Encounter: Payer: Self-pay | Admitting: Family Medicine

## 2011-10-14 VITALS — BP 120/80 | HR 77 | Resp 16 | Ht 69.5 in | Wt 189.0 lb

## 2011-10-14 DIAGNOSIS — E663 Overweight: Secondary | ICD-10-CM

## 2011-10-14 DIAGNOSIS — R748 Abnormal levels of other serum enzymes: Secondary | ICD-10-CM

## 2011-10-14 DIAGNOSIS — Z0000 Encounter for general adult medical examination without abnormal findings: Secondary | ICD-10-CM

## 2011-10-14 DIAGNOSIS — R7402 Elevation of levels of lactic acid dehydrogenase (LDH): Secondary | ICD-10-CM

## 2011-10-14 NOTE — Assessment & Plan Note (Signed)
Unchanged, opt to work on lifestyle change to facilitate weight loss

## 2011-10-14 NOTE — Assessment & Plan Note (Signed)
Mild elevation in 1 liver enzyme. Hope that weight loss only will correct. Denies dypesia, abdominal pai or bloating, normal gall bladder studies in the past. Will rept enzymes in 3 week

## 2011-10-14 NOTE — Progress Notes (Signed)
  Subjective:    Patient ID: Isaac Hubbard, male    DOB: 11/19/1980, 31 y.o.   MRN: [id number]  HPI The PT is here for annual exam   and review of any available recent lab and radiology data.  Preventive health is updated, specifically   Immunization.   The PT denies any adverse reactions to current medications since the last visit.  There are no new concerns.  There are no specific complaints . He continues to be nicotine free since December.Which is wonderful. He has noted increased apetiie this week, but has not been at work as much. Is aware of always having slightly elevated cholesterol , and is trying to work on this by [employer]. He is not in any regular exercise program, but understands the value of exercise      Review of Systems See HPI Denies recent fever or chills. Denies sinus pressure, nasal congestion, ear pain or sore throat. Denies chest congestion, productive cough or wheezing. Denies chest pains, palpitations and leg swelling Denies abdominal pain, nausea, vomiting,diarrhea or constipation.   Denies dysuria, frequency, hesitancy or incontinence. Denies joint pain, swelling and limitation in mobility. Denies headaches, seizures, numbness, or tingling. Denies depression, anxiety or insomnia. Denies skin break down or rash.        Objective:   Physical Exam Pleasant well nourished male, alert and oriented x 3, in no cardio-pulmonary distress. Afebrile. HEENT No facial trauma or asymetry. Sinuses non tender. EOMI, PERTL,., decreased acuity in right eye External ears normal, tympanic membranes clear. Oropharynx moist, no exudate, good dentition. Neck: supple, no adenopathy,JVD or thyromegaly.No bruits.  Chest: Clear to ascultation bilaterally.No crackles or wheezes. Non tender to palpation  Breast: No asymetry,no masses. No nipple discharge or inversion. No axillary or supraclavicular adenopathy  Cardiovascular system; Heart sounds normal,   S1 and  S2 ,no S3.  No murmur, or thrill. Apical beat not displaced Peripheral pulses normal.  Abdomen: Soft, non tender, no organomegaly or masses. No bruits. Bowel sounds normal. No guarding, tenderness or rebound.   Musculoskeletal exam: Full ROM of spine, hips , shoulders and knees. No deformity ,swelling or crepitus noted. No muscle wasting or atrophy.   Neurologic: Cranial nerves 2 to 12 intact. Power, tone ,sensation and reflexes normal throughout. No disturbance in gait. No tremor.  Skin: Intact, no ulceration, erythema , scaling or rash noted. Pigmentation normal throughout  Psych; Normal mood and affect. Judgement and concentration normal        Assessment & Plan:

## 2011-10-14 NOTE — Patient Instructions (Signed)
F/u in 1 year , or as needed.  Congrats on smoking cessation!!!  Pls cut back on fried and fatty foods, red meat, your bad cholesterol is better than it last was, but still a little high.   One liver enzyme is slightly elevated(2 above normal)  Please aim to lose 5 to 7 pounds in the next 6 months.  You need ast/alt non fasting lab in 5 month and 3 weeks to ensure no worse and hopefully back to normal   Please make sure that you have eye exams by a specialist on a regular basis since the vision in your right eye is not good.  Please stop sticking q tips in your ears, they are self cleaning, your left inner ear has a little bruise from this  You do need TdAp , call back if you decide on this please

## 2011-10-25 ENCOUNTER — Ambulatory Visit (INDEPENDENT_AMBULATORY_CARE_PROVIDER_SITE_OTHER): Payer: 59

## 2011-10-25 VITALS — BP 122/76 | Wt 189.0 lb

## 2011-10-25 DIAGNOSIS — Z024 Encounter for examination for driving license: Secondary | ICD-10-CM

## 2011-10-25 DIAGNOSIS — Z0289 Encounter for other administrative examinations: Secondary | ICD-10-CM

## 2011-10-25 LAB — POCT URINALYSIS DIPSTICK
Bilirubin, UA: NEGATIVE
Blood, UA: NEGATIVE
Glucose, UA: NEGATIVE
Ketones, UA: NEGATIVE
Leukocytes, UA: NEGATIVE
Nitrite, UA: NEGATIVE
Protein, UA: NEGATIVE
Spec Grav, UA: 1.015
Urobilinogen, UA: 0.2
pH, UA: 6

## 2011-10-25 NOTE — Progress Notes (Signed)
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

## 2012-02-21 ENCOUNTER — Other Ambulatory Visit: Payer: Self-pay | Admitting: Family Medicine

## 2012-02-21 LAB — AST: AST: 44 U/L — ABNORMAL HIGH (ref 0–37)

## 2012-02-21 LAB — ALT: ALT: 80 U/L — ABNORMAL HIGH (ref 0–53)

## 2012-02-22 ENCOUNTER — Other Ambulatory Visit: Payer: Self-pay | Admitting: Family Medicine

## 2012-02-22 DIAGNOSIS — R7989 Other specified abnormal findings of blood chemistry: Secondary | ICD-10-CM

## 2012-02-22 NOTE — Progress Notes (Signed)
Pt [facility] appt at [facility] for 02/23/2012 at 7:45. Pt has to be NPO. pts mother was advised and is aware. Also has appt card.

## 2012-02-23 ENCOUNTER — Ambulatory Visit (HOSPITAL_COMMUNITY)
Admission: RE | Admit: 2012-02-23 | Discharge: 2012-02-23 | Disposition: A | Discharge status: Home / Self Care | Payer: BC Managed Care – PPO | Source: Ambulatory Visit | Attending: Family Medicine | Admitting: Family Medicine

## 2012-02-23 DIAGNOSIS — R935 Abnormal findings on diagnostic imaging of other abdominal regions, including retroperitoneum: Secondary | ICD-10-CM | POA: Insufficient documentation

## 2012-02-23 DIAGNOSIS — R748 Abnormal levels of other serum enzymes: Secondary | ICD-10-CM | POA: Insufficient documentation

## 2012-02-23 DIAGNOSIS — R7989 Other specified abnormal findings of blood chemistry: Secondary | ICD-10-CM

## 2012-02-23 NOTE — Addendum Note (Signed)
Addended by: Abner Greenspan on: 02/23/2012 02:07 PM   Modules accepted: Orders

## 2012-02-25 LAB — HEPATITIS PANEL, ACUTE
HCV Ab: NEGATIVE
Hep A IgM: NEGATIVE
Hep B C IgM: NEGATIVE
Hepatitis B Surface Ag: NEGATIVE

## 2012-07-26 ENCOUNTER — Other Ambulatory Visit: Payer: Self-pay | Admitting: Family Medicine

## 2012-07-26 LAB — AST: AST: 27 U/L (ref 0–37)

## 2012-07-26 LAB — ALT: ALT: 34 U/L (ref 0–53)

## 2012-10-20 ENCOUNTER — Telehealth: Payer: Self-pay | Admitting: Family Medicine

## 2012-10-20 MED ORDER — PREDNISONE (PAK) 5 MG PO TABS: 5.0000 mg | ORAL_TABLET | ORAL | Status: DC

## 2012-10-20 MED ORDER — AZITHROMYCIN 250 MG PO TABS: ORAL_TABLET | ORAL | Status: AC

## 2012-10-20 NOTE — Telephone Encounter (Signed)
5 day h/o head congestion with progressive loss of voice, occasional green sputum, denies body aches and chills, no headache or sore throat Mother aware that prednisone dose pack and z pack will be prescribed, pt will need OV if symptoms worsen, meds sent to [facility]ise is currently working in [place] and will not be in town before the end of the workday

## 2012-11-16 ENCOUNTER — Telehealth: Payer: Self-pay

## 2012-11-17 ENCOUNTER — Encounter: Payer: BC Managed Care – PPO | Admitting: Family Medicine

## 2012-11-20 ENCOUNTER — Ambulatory Visit (INDEPENDENT_AMBULATORY_CARE_PROVIDER_SITE_OTHER): Payer: BC Managed Care – PPO | Admitting: Family Medicine

## 2012-11-20 ENCOUNTER — Encounter: Payer: Self-pay | Admitting: Family Medicine

## 2012-11-20 VITALS — BP 118/80 | HR 74 | Resp 16 | Ht 69.5 in | Wt 191.4 lb

## 2012-11-20 DIAGNOSIS — Z83438 Family history of other disorder of lipoprotein metabolism and other lipidemia: Secondary | ICD-10-CM

## 2012-11-20 DIAGNOSIS — Z8349 Family history of other endocrine, nutritional and metabolic diseases: Secondary | ICD-10-CM

## 2012-11-20 DIAGNOSIS — Z0001 Encounter for general adult medical examination with abnormal findings: Secondary | ICD-10-CM | POA: Insufficient documentation

## 2012-11-20 DIAGNOSIS — Z23 Encounter for immunization: Secondary | ICD-10-CM

## 2012-11-20 DIAGNOSIS — E663 Overweight: Secondary | ICD-10-CM

## 2012-11-20 DIAGNOSIS — Z0000 Encounter for general adult medical examination without abnormal findings: Secondary | ICD-10-CM

## 2012-11-20 LAB — COMPREHENSIVE METABOLIC PANEL
ALT: 49 U/L (ref 0–53)
AST: 32 U/L (ref 0–37)
Albumin: 5 g/dL (ref 3.5–5.2)
Alkaline Phosphatase: 56 U/L (ref 39–117)
BUN: 28 mg/dL — ABNORMAL HIGH (ref 6–23)
CO2: 27 mEq/L (ref 19–32)
Calcium: 10 mg/dL (ref 8.4–10.5)
Chloride: 103 mEq/L (ref 96–112)
Creat: 1.12 mg/dL (ref 0.50–1.35)
Glucose, Bld: 83 mg/dL (ref 70–99)
Potassium: 4.2 mEq/L (ref 3.5–5.3)
Sodium: 138 mEq/L (ref 135–145)
Total Bilirubin: 0.5 mg/dL (ref 0.3–1.2)
Total Protein: 7.8 g/dL (ref 6.0–8.3)

## 2012-11-20 LAB — LIPID PANEL
Cholesterol: 230 mg/dL — ABNORMAL HIGH (ref 0–200)
HDL: 41 mg/dL (ref >39)
LDL Cholesterol: 171 mg/dL — ABNORMAL HIGH (ref 0–99)
Total CHOL/HDL Ratio: 5.6 Ratio
Triglycerides: 90 mg/dL (ref <150)
VLDL: 18 mg/dL (ref 0–40)

## 2012-11-20 LAB — CBC
HCT: 44.4 % (ref 39.0–52.0)
Hemoglobin: 15.9 g/dL (ref 13.0–17.0)
MCH: 31.6 pg (ref 26.0–34.0)
MCHC: 35.8 g/dL (ref 30.0–36.0)
MCV: 88.3 fL (ref 78.0–100.0)
Platelets: 177 10*3/uL (ref 150–400)
RBC: 5.03 MIL/uL (ref 4.22–5.81)
RDW: 13.5 % (ref 11.5–15.5)
WBC: 5.1 10*3/uL (ref 4.0–10.5)

## 2012-11-20 NOTE — Assessment & Plan Note (Signed)
Physical exam completed. He has a wellness form for his work. Immunizations not up-to-date. Fasting labs to be done we discussed HIV test he does not think he's had this before this will also be obtained. See patient instructions

## 2012-11-20 NOTE — Patient Instructions (Addendum)
I recommend eye visit once a year I recommend dental visit every 6 months Goal is to  Exercise 30 minutes 5 days a week We will send a letter with lab results  Form will be sent in F/U 1 year for physical

## 2012-11-20 NOTE — Progress Notes (Signed)
  Subjective:    Patient ID: Isaac Hubbard, male    DOB: [birth year], 32 y.o.   MRN: [id number]  HPI  Patient here for anal physical exam. He has no specific concerns. Medications and history reviewed. He's due for fasting labs Immunizations reviewed due for TDAP  Review of Systems  GEN- denies fatigue, fever, weight loss,weakness, recent illness HEENT- denies eye drainage, change in vision, nasal discharge, CVS- denies chest pain, palpitations RESP- denies SOB, cough, wheeze ABD- denies N/V, change in stools, abd pain GU- denies dysuria, hematuria, dribbling, incontinence MSK- denies joint pain, muscle aches, injury Neuro- denies headache, dizziness, syncope, seizure activity      Objective:   Physical Exam  GEN- NAD, alert and oriented x3 HEENT- PERRL, EOMI, non injected sclera, pink conjunctiva, MMM, oropharynx clear Neck- Supple,  CVS- RRR, no murmur RESP-CTAB ABD-NABS,soft,NT,ND EXT- No edema Pulses- Radial, DP- 2+ Neuro-CNII-XII in tact, no focal deficits , DTR symmetric MSK- UE/LE FROM       Assessment & Plan:

## 2012-11-20 NOTE — Assessment & Plan Note (Signed)
Continues to exercise, little change in weight, FLP and glucose will be checked with routine CPE labs

## 2012-11-20 NOTE — Addendum Note (Signed)
Addended by: Kandis Fantasia B on: 11/20/2012 02:03 PM   Modules accepted: Orders

## 2012-11-21 LAB — HIV ANTIBODY (ROUTINE TESTING W REFLEX): HIV: NONREACTIVE

## 2012-11-22 LAB — NICOTINE/COTININE METABOLITES: Cotinine: <10 ng/mL

## 2012-11-23 NOTE — Telephone Encounter (Signed)
Had to order labwork

## 2012-11-27 NOTE — Addendum Note (Signed)
Addended by: Kandis Fantasia B on: 11/27/2012 03:46 PM   Modules accepted: Orders

## 2013-02-07 ENCOUNTER — Telehealth: Payer: Self-pay | Admitting: Family Medicine

## 2013-02-07 DIAGNOSIS — M25511 Pain in right shoulder: Secondary | ICD-10-CM

## 2013-02-07 NOTE — Telephone Encounter (Signed)
Pt reportedly has several month h/o right shoulder pain worse in the past several weeks. Xray requested prior to ortho eval, pt currently out of town. Order sent to the hospital and mother aware

## 2013-02-09 ENCOUNTER — Ambulatory Visit (HOSPITAL_COMMUNITY)
Admission: RE | Admit: 2013-02-09 | Discharge: 2013-02-09 | Disposition: A | Discharge status: Home / Self Care | Payer: BC Managed Care – PPO | Source: Ambulatory Visit | Attending: Family Medicine | Admitting: Family Medicine

## 2013-02-09 DIAGNOSIS — M25511 Pain in right shoulder: Secondary | ICD-10-CM

## 2013-02-09 DIAGNOSIS — M25519 Pain in unspecified shoulder: Secondary | ICD-10-CM | POA: Insufficient documentation

## 2013-02-15 ENCOUNTER — Encounter: Payer: Self-pay | Admitting: Orthopedic Surgery

## 2013-02-15 ENCOUNTER — Ambulatory Visit (INDEPENDENT_AMBULATORY_CARE_PROVIDER_SITE_OTHER): Payer: BC Managed Care – PPO | Admitting: Orthopedic Surgery

## 2013-02-15 VITALS — BP 104/68 | Ht 71.5 in | Wt 182.0 lb

## 2013-02-15 DIAGNOSIS — M67919 Unspecified disorder of synovium and tendon, unspecified shoulder: Secondary | ICD-10-CM

## 2013-02-15 DIAGNOSIS — M75101 Unspecified rotator cuff tear or rupture of right shoulder, not specified as traumatic: Secondary | ICD-10-CM

## 2013-02-15 DIAGNOSIS — M719 Bursopathy, unspecified: Secondary | ICD-10-CM

## 2013-02-15 MED ORDER — DICLOFENAC POTASSIUM 50 MG PO TABS: 50.0000 mg | ORAL_TABLET | Freq: Two times a day (BID) | ORAL | Status: DC

## 2013-02-15 NOTE — Patient Instructions (Addendum)
You have received a steroid shot. 15% of patients experience increased pain at the injection site with in the next 24 hours. This is best treated with ice and tylenol extra strength 2 tabs every 8 hours. If you are still having pain please call the office.   Start home exercises (Go to therapy to learn exercises)  Impingement Syndrome, Rotator Cuff, Bursitis with Rehab Impingement syndrome is a condition that involves inflammation of the tendons of the rotator cuff and the subacromial bursa, that causes pain in the shoulder. The rotator cuff consists of four tendons and muscles that control much of the shoulder and upper arm function. The subacromial bursa is a fluid filled sac that helps reduce friction between the rotator cuff and one of the bones of the shoulder (acromion). Impingement syndrome is usually an overuse injury that causes swelling of the bursa (bursitis), swelling of the tendon (tendonitis), and/or a tear of the tendon (strain). Strains are classified into three categories. Grade 1 strains cause pain, but the tendon is not lengthened. Grade 2 strains include a lengthened ligament, due to the ligament being stretched or partially ruptured. With grade 2 strains there is still function, although the function may be decreased. Grade 3 strains include a complete tear of the tendon or muscle, and function is usually impaired. SYMPTOMS   Pain around the shoulder, often at the outer portion of the upper arm.  Pain that gets worse with shoulder function, especially when reaching overhead or lifting.  Sometimes, aching when not using the arm.  Pain that wakes you up at night.  Sometimes, tenderness, swelling, warmth, or redness over the affected area.  Loss of strength.  Limited motion of the shoulder, especially reaching behind the back (to the back pocket or to unhook bra) or across your body.  Crackling sound (crepitation) when moving the arm.  Biceps tendon pain and inflammation  (in the front of the shoulder). Worse when bending the elbow or lifting. CAUSES  Impingement syndrome is often an overuse injury, in which chronic (repetitive) motions cause the tendons or bursa to become inflamed. A strain occurs when a force is paced on the tendon or muscle that is greater than it can withstand. Common mechanisms of injury include: Stress from sudden increase in duration, frequency, or intensity of training.  Direct hit (trauma) to the shoulder.  Aging, erosion of the tendon with normal use.  Bony bump on shoulder (acromial spur). RISK INCREASES WITH:  Contact sports (football, wrestling, boxing).  Throwing sports (baseball, tennis, volleyball).  Weightlifting and bodybuilding.  Heavy labor.  Previous injury to the rotator cuff, including impingement.  Poor shoulder strength and flexibility.  Failure to warm up properly before activity.  Inadequate protective equipment.  Old age.  Bony bump on shoulder (acromial spur). PREVENTION   Warm up and stretch properly before activity.  Allow for adequate recovery between workouts.  Maintain physical fitness:  Strength, flexibility, and endurance.  Cardiovascular fitness.  Learn and use proper exercise technique. PROGNOSIS  If treated properly, impingement syndrome usually goes away within 6 weeks. Sometimes surgery is required.  RELATED COMPLICATIONS   Longer healing time if not properly treated, or if not given enough time to heal.  Recurring symptoms, that result in a chronic condition.  Shoulder stiffness, frozen shoulder, or loss of motion.  Rotator cuff tendon tear.  Recurring symptoms, especially if activity is resumed too soon, with overuse, with a direct blow, or when using poor technique. TREATMENT  Treatment first involves the  use of ice and medicine, to reduce pain and inflammation. The use of strengthening and stretching exercises may help reduce pain with activity. These exercises may  be performed at home or with a therapist. If non-surgical treatment is unsuccessful after more than 6 months, surgery may be advised. After surgery and rehabilitation, activity is usually possible in 3 months.  MEDICATION  If pain medicine is needed, nonsteroidal anti-inflammatory medicines (aspirin and ibuprofen), or other minor pain relievers (acetaminophen), are often advised.  Do not take pain medicine for 7 days before surgery.  Prescription pain relievers may be given, if your caregiver thinks they are needed. Use only as directed and only as much as you need.  Corticosteroid injections may be given by your caregiver. These injections should be reserved for the most serious cases, because they may only be given a certain number of times. HEAT AND COLD  Cold treatment (icing) should be applied for 10 to 15 minutes every 2 to 3 hours for inflammation and pain, and immediately after activity that aggravates your symptoms. Use ice packs or an ice massage.  Heat treatment may be used before performing stretching and strengthening activities prescribed by your caregiver, physical therapist, or athletic trainer. Use a heat pack or a warm water soak. SEEK MEDICAL CARE IF:   Symptoms get worse or do not improve in 4 to 6 weeks, despite treatment.  New, unexplained symptoms develop. (Drugs used in treatment may produce side effects.)   Call if no improvement after 6 weeks of exercises and oral nsaids

## 2013-02-17 ENCOUNTER — Encounter: Payer: Self-pay | Admitting: Orthopedic Surgery

## 2013-02-17 DIAGNOSIS — M75101 Unspecified rotator cuff tear or rupture of right shoulder, not specified as traumatic: Secondary | ICD-10-CM | POA: Insufficient documentation

## 2013-02-17 NOTE — Progress Notes (Signed)
Patient ID: Isaac Hubbard, male   DOB: 1981-08-21, 32 y.o.   MRN: [id number] Chief Complaint  Patient presents with  . Shoulder Pain    Right shoulder pain, no injury    32 year old male involved in heavy lifting at work and repetitive activity at work presents with history of right shoulder pain which worsened after he went to the beach and stepped on his arm in a funny position. He is painful for elevation and difficulty reaching behind his back. He has nocturnal pain as well. He does not report frank weakness but the arm does feel like it may be giving out on him. He denies any trauma. The pain is moderate to severe and unrelieved by over-the-counter medication.  Review of systems please see patient recorded review of systems  The past, family history and social history have been reviewed and are recorded above   Vital signs are stable as recorded  General appearance is normal  The patient is alert and oriented x3  The patient's mood and affect are normal  Gait assessment: Ambulation is normal  The cardiovascular exam reveals normal pulses and temperature without edema or  swelling.  The lymphatic system is negative for palpable lymph nodes  The sensory exam is normal.  There are no pathologic reflexes.  Balance is normal.   Exam of the left shoulder full range of motion no contracture subluxation atrophy or tremor no rash lesion or ulcer or laceration on the skin Right shoulder painful for elevation tenderness around the anterolateral deltoid, negative tenderness over the a.c. joint. The shoulder is stable in abduction external rotation as well as inferior stress test and direct posterior pressure in flexion. Positive impingement sign. Rotator cuff strength normal. Skin normal without laceration.  Impression rotator cuff syndrome  Recommend subacromial injection in home exercise program via physical therapy department  Shoulder Injection Procedure Note   Pre-operative  Diagnosis: right  RC Syndrome  Post-operative Diagnosis: same  Indications: pain   Anesthesia: ethyl chloride   Procedure Details   Verbal consent was obtained for the procedure. The shoulder was prepped withalcohol and the skin was anesthetized. A 20 gauge needle was advanced into the subacromial space through posterior approach without difficulty  The space was then injected with 3 ml 1% lidocaine and 1 ml of depomedrol. The injection site was cleansed with isopropyl alcohol and a dressing was applied.  Complications:  None; patient tolerated the procedure well.  Encounter Diagnosis  Name Primary?  . Rotator cuff syndrome of right shoulder Yes

## 2013-02-27 ENCOUNTER — Telehealth: Payer: Self-pay | Admitting: Orthopedic Surgery

## 2013-02-27 NOTE — Telephone Encounter (Signed)
Per message from [place] at [facility]/Physical therapy department (ph [phone]) patient relayed that he wishes to hold on therapy at this time, as he is doing well from injection and prescribed treatment.

## 2013-06-11 IMAGING — US US ABDOMEN COMPLETE
1 series · 14 of 25 positions shown · non-contrast
Comparison: CT scan 09/14/2003

CLINICAL DATA: Elevated liver enzymes

COMPLETE ABDOMINAL ULTRASOUND

[Series 1: us abdomen complete · 0.33mm/px · 14 of 64 slices shown]
[im 1/64]
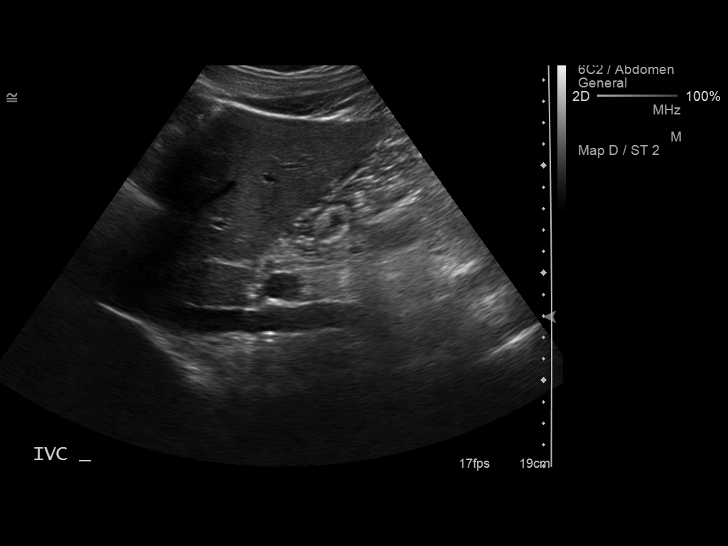
[im 6/64]
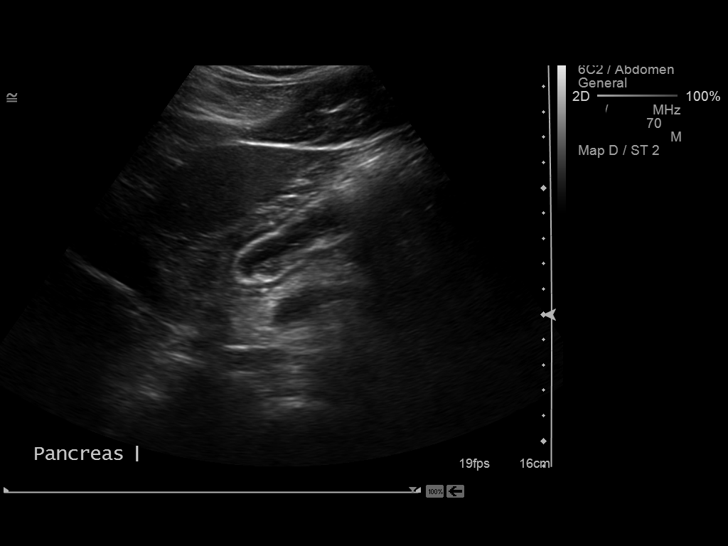
[im 11/64]
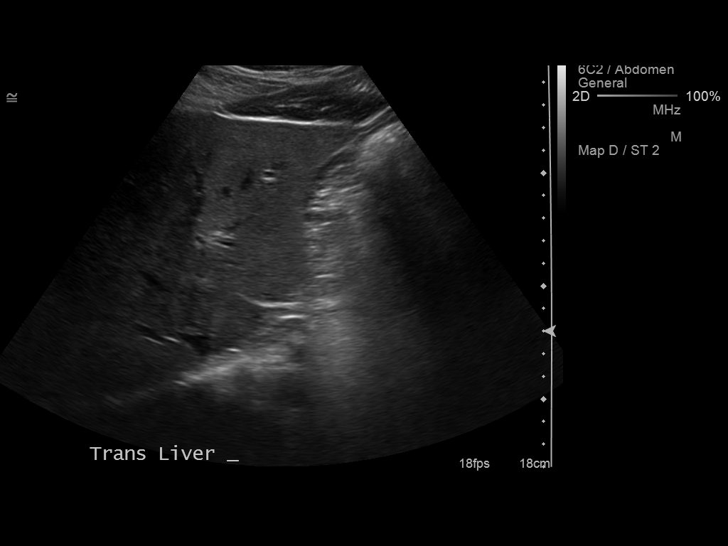
[im 16/64]
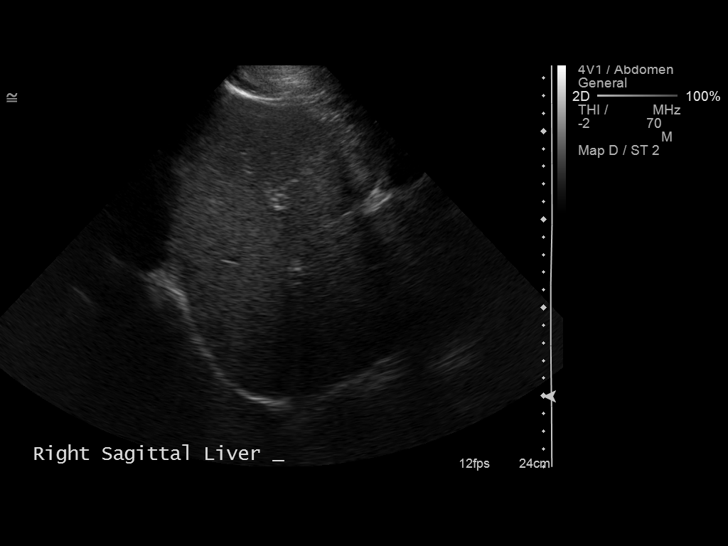
[im 22/64]
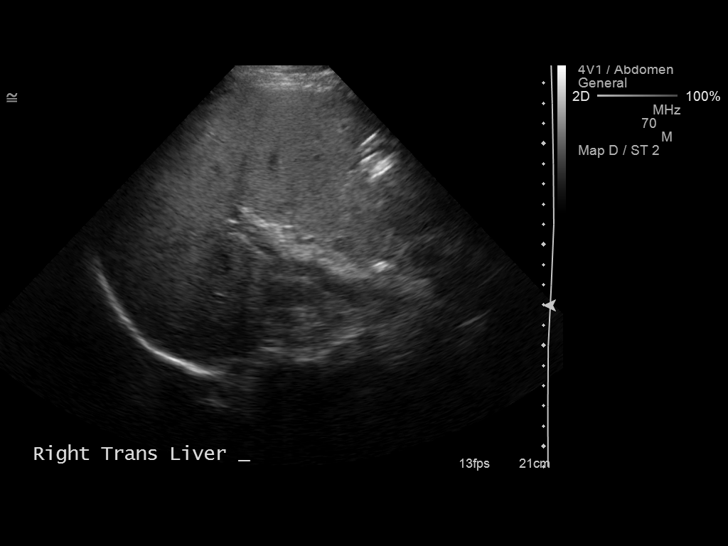
[im 24/64]
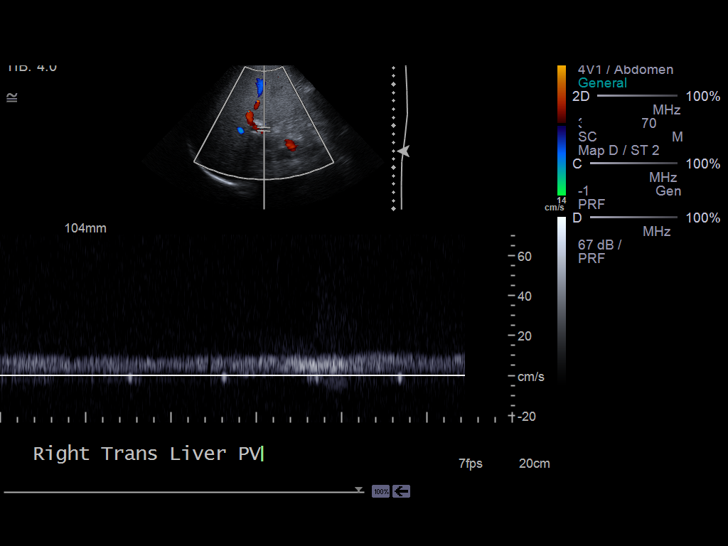
[im 29/64]
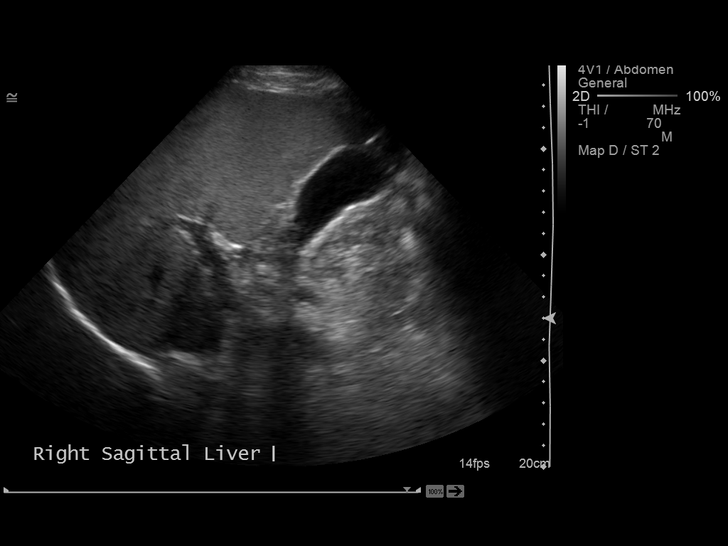
[im 35/64]
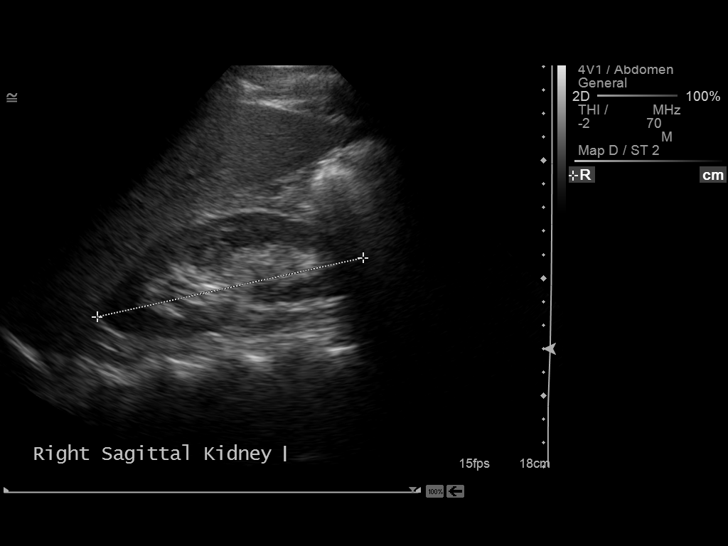
[im 40/64]
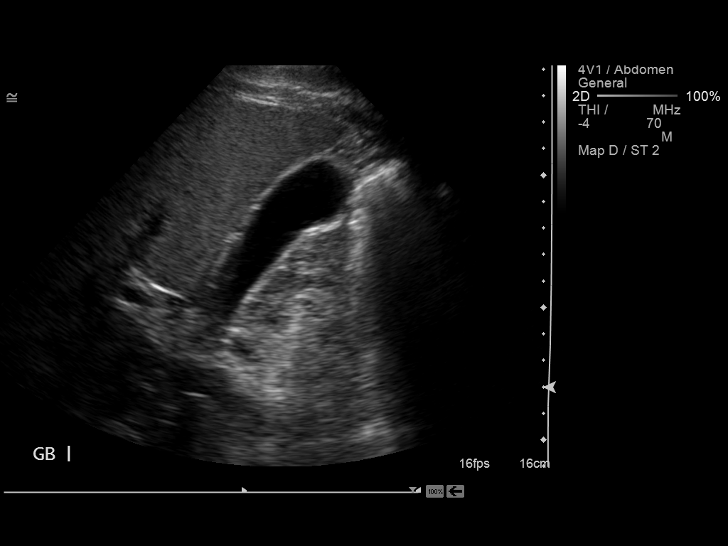
[im 43/64]
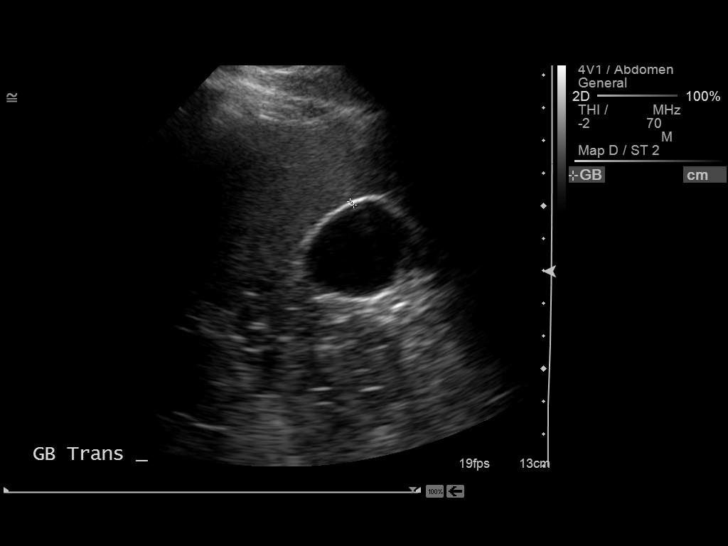
[im 48/64]
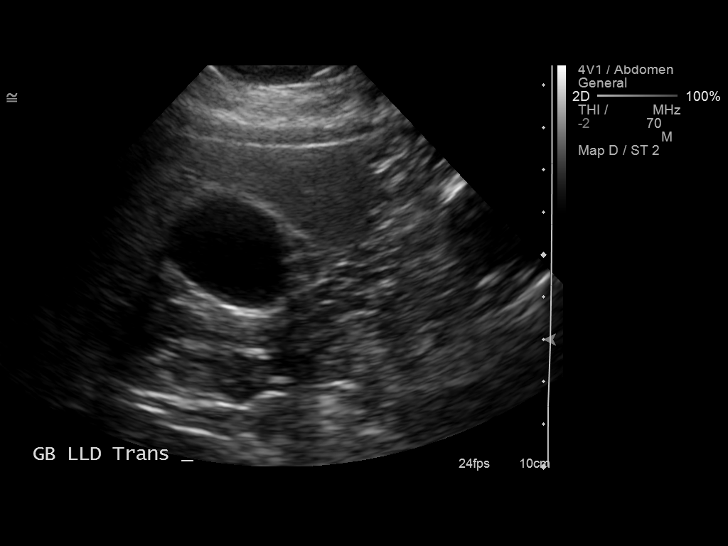
[im 53/64]
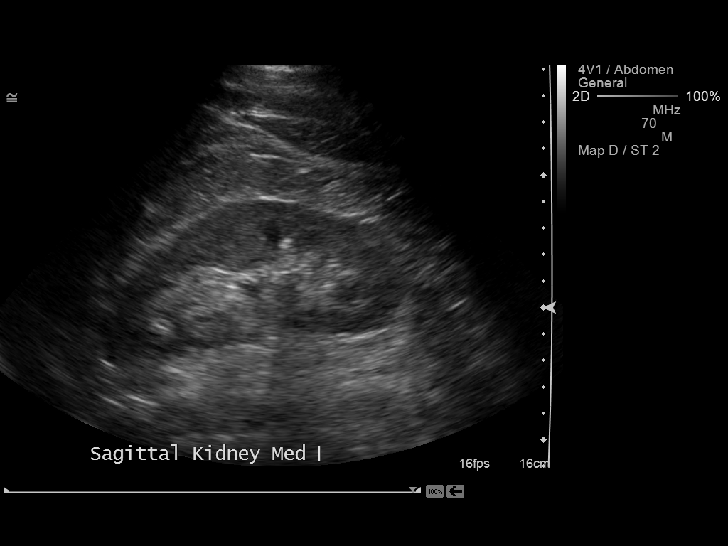
[im 58/64]
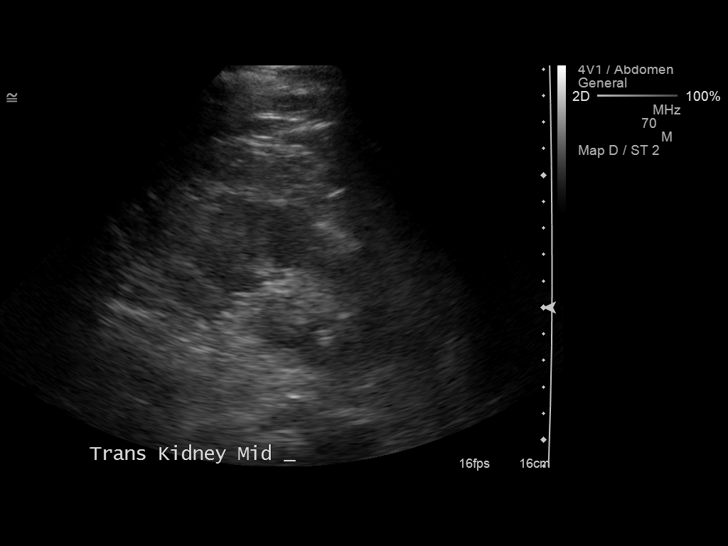
[im 64/64]
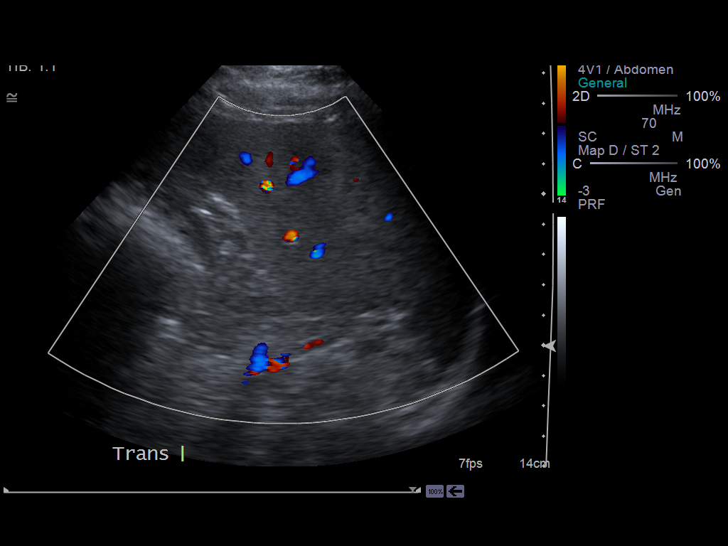

[14 of 25 positions shown; findings below may reference images not displayed]

FINDINGS: Gallbladder:  No gallstones, gallbladder wall thickening, or
pericholecystic fluid.  No sonographic Murphy's sign

Common bile duct:  Measures 3.6 mm in diameter within normal
limits.

Liver:  No focal lesion identified. Liver shows mild increased
echogenicity suspicious for fatty infiltration.  No intrahepatic
biliary ductal dilatation.

IVC:  Appears normal.

Pancreas:  No focal abnormality seen.

Spleen:  Measures 9 cm in length.  Normal echogenicity

Right Kidney:  Measures 11.6 cm in length.  No mass, hydronephrosis
or diagnostic renal calculus

Left Kidney:  Measures 11.5 cm in length.  No mass, hydronephrosis
or diagnostic renal calculus

Abdominal aorta:  No aneurysm identified. Measures up to 2.3 cm in
diameter.

IMPRESSION

1.  No gallstones are noted within gallbladder.  Normal CBD.
2.  Mild increased echogenicity of the liver suspicious for fatty
infiltration.  No intrahepatic biliary ductal dilatation.
3.  No hydronephrosis or diagnostic renal calculus.

## 2013-06-18 ENCOUNTER — Ambulatory Visit (INDEPENDENT_AMBULATORY_CARE_PROVIDER_SITE_OTHER): Payer: BC Managed Care – PPO | Admitting: Orthopedic Surgery

## 2013-06-18 ENCOUNTER — Encounter: Payer: Self-pay | Admitting: Orthopedic Surgery

## 2013-06-18 VITALS — BP 109/76 | Ht 71.5 in | Wt 170.6 lb

## 2013-06-18 DIAGNOSIS — M25519 Pain in unspecified shoulder: Secondary | ICD-10-CM

## 2013-06-18 DIAGNOSIS — M25511 Pain in right shoulder: Secondary | ICD-10-CM

## 2013-06-18 MED ORDER — DICLOFENAC POTASSIUM 50 MG PO TABS: 50.0000 mg | ORAL_TABLET | Freq: Two times a day (BID) | ORAL | Status: DC

## 2013-06-18 NOTE — Patient Instructions (Addendum)
You have received a steroid shot. 15% of patients experience increased pain at the injection site with in the next 24 hours. This is best treated with ice and tylenol extra strength 2 tabs every 8 hours. If you are still having pain please call the office.   Take diclofenac twice daily  Pick up prescription from pharmacy

## 2013-06-18 NOTE — Progress Notes (Signed)
Patient ID: Vicente Masson, male   DOB: Mar 26, 1981, 32 y.o.   MRN: [id number] Chief Complaint  Patient presents with  . Follow-up    Recheck right shoulder pain.    Requests repeat injection right shoulder. First injection did well. Pain resolved and then came back over the last week with increased activities at work. Medication did well no complications  Repeat right shoulder injection Encounter Diagnosis  Name Primary?  . Pain in joint, shoulder region, right Yes    Shoulder Injection Procedure Note   Pre-operative Diagnosis: right  RC Syndrome  Post-operative Diagnosis: same  Indications: pain   Anesthesia: ethyl chloride   Procedure Details   Verbal consent was obtained for the procedure. The shoulder was prepped withalcohol and the skin was anesthetized. A 20 gauge needle was advanced into the subacromial space through posterior approach without difficulty  The space was then injected with 3 ml 1% lidocaine and 1 ml of depomedrol. The injection site was cleansed with isopropyl alcohol and a dressing was applied.  Complications:  None; patient tolerated the procedure well.

## 2013-09-13 HISTORY — PX: SPINE SURGERY: SHX786

## 2013-11-30 ENCOUNTER — Encounter: Payer: BC Managed Care – PPO | Admitting: Family Medicine

## 2014-02-26 ENCOUNTER — Ambulatory Visit (INDEPENDENT_AMBULATORY_CARE_PROVIDER_SITE_OTHER): Payer: BC Managed Care – PPO | Admitting: Orthopedic Surgery

## 2014-02-26 ENCOUNTER — Ambulatory Visit (INDEPENDENT_AMBULATORY_CARE_PROVIDER_SITE_OTHER): Payer: BC Managed Care – PPO

## 2014-02-26 VITALS — Ht 71.5 in | Wt 170.0 lb

## 2014-02-26 DIAGNOSIS — M542 Cervicalgia: Secondary | ICD-10-CM

## 2014-02-26 DIAGNOSIS — M5020 Other cervical disc displacement, unspecified cervical region: Secondary | ICD-10-CM | POA: Insufficient documentation

## 2014-02-26 MED ORDER — GABAPENTIN 100 MG PO CAPS
100.0000 mg | ORAL_CAPSULE | Freq: Every day | ORAL | Status: DC
Start: 2014-02-26 — End: 2015-08-28

## 2014-02-26 MED ORDER — CYCLOBENZAPRINE HCL 10 MG PO TABS: 10.0000 mg | ORAL_TABLET | Freq: Every day | ORAL | Status: DC

## 2014-02-26 NOTE — Patient Instructions (Signed)
MRI ordered
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
## 2014-02-28 ENCOUNTER — Other Ambulatory Visit: Payer: Self-pay | Admitting: Orthopedic Surgery

## 2014-02-28 ENCOUNTER — Ambulatory Visit (HOSPITAL_COMMUNITY)
Admission: RE | Admit: 2014-02-28 | Discharge: 2014-02-28 | Disposition: A | Discharge status: Home / Self Care | Payer: BC Managed Care – PPO | Source: Ambulatory Visit | Attending: Orthopedic Surgery | Admitting: Orthopedic Surgery

## 2014-02-28 DIAGNOSIS — R209 Unspecified disturbances of skin sensation: Secondary | ICD-10-CM | POA: Insufficient documentation

## 2014-02-28 DIAGNOSIS — M542 Cervicalgia: Secondary | ICD-10-CM

## 2014-02-28 DIAGNOSIS — M5020 Other cervical disc displacement, unspecified cervical region: Secondary | ICD-10-CM

## 2014-03-01 ENCOUNTER — Encounter: Payer: Self-pay | Admitting: Orthopedic Surgery

## 2014-03-01 NOTE — Progress Notes (Signed)
Patient ID: Isaac Hubbard, male   DOB: 1981-02-28, 33 y.o.   MRN: [id number]  MRI followup  The patient is large C6-C7 disc which is symptomatic  Recommend neurosurgical followup as soon as we can get it done. I've spoken to the patient and he understands the nature of his pathology and is agreeable to neurosurgical consultation.

## 2014-03-04 ENCOUNTER — Telehealth: Payer: Self-pay | Admitting: *Deleted

## 2014-03-04 ENCOUNTER — Other Ambulatory Visit: Payer: Self-pay | Admitting: *Deleted

## 2014-03-04 DIAGNOSIS — M5020 Other cervical disc displacement, unspecified cervical region: Secondary | ICD-10-CM

## 2014-03-04 NOTE — Telephone Encounter (Signed)
Office notes and referral faxed to [facility].Patient has an appointment 03/08/14 at 10:00 am with Dr. Wynetta Emeryram. Patient is aware.

## 2014-03-05 ENCOUNTER — Encounter: Payer: Self-pay | Admitting: Orthopedic Surgery

## 2014-03-11 ENCOUNTER — Ambulatory Visit: Payer: BC Managed Care – PPO | Admitting: Orthopedic Surgery

## 2014-11-15 ENCOUNTER — Other Ambulatory Visit: Payer: Self-pay

## 2014-11-15 DIAGNOSIS — Z0000 Encounter for general adult medical examination without abnormal findings: Secondary | ICD-10-CM

## 2014-12-13 ENCOUNTER — Encounter: Payer: BC Managed Care – PPO | Admitting: Family Medicine

## 2015-03-20 ENCOUNTER — Telehealth: Payer: Self-pay

## 2015-03-20 DIAGNOSIS — Z13220 Encounter for screening for lipoid disorders: Secondary | ICD-10-CM

## 2015-03-20 DIAGNOSIS — Z0000 Encounter for general adult medical examination without abnormal findings: Secondary | ICD-10-CM

## 2015-03-20 DIAGNOSIS — Z1211 Encounter for screening for malignant neoplasm of colon: Secondary | ICD-10-CM

## 2015-03-20 DIAGNOSIS — Z131 Encounter for screening for diabetes mellitus: Secondary | ICD-10-CM

## 2015-03-20 DIAGNOSIS — E663 Overweight: Secondary | ICD-10-CM

## 2015-03-20 NOTE — Telephone Encounter (Signed)
Labs ordered.

## 2015-04-07 ENCOUNTER — Other Ambulatory Visit (HOSPITAL_COMMUNITY): Payer: Self-pay | Admitting: Neurosurgery

## 2015-04-07 DIAGNOSIS — M5023 Other cervical disc displacement, cervicothoracic region: Secondary | ICD-10-CM

## 2015-04-15 ENCOUNTER — Ambulatory Visit (HOSPITAL_COMMUNITY)
Admission: RE | Admit: 2015-04-15 | Discharge: 2015-04-15 | Disposition: A | Discharge status: Home / Self Care | Payer: BLUE CROSS/BLUE SHIELD | Source: Ambulatory Visit | Attending: Neurosurgery | Admitting: Neurosurgery

## 2015-04-15 DIAGNOSIS — M5022 Other cervical disc displacement, mid-cervical region: Secondary | ICD-10-CM | POA: Insufficient documentation

## 2015-04-15 DIAGNOSIS — M5023 Other cervical disc displacement, cervicothoracic region: Secondary | ICD-10-CM

## 2015-04-15 DIAGNOSIS — R200 Anesthesia of skin: Secondary | ICD-10-CM | POA: Diagnosis not present

## 2015-04-28 ENCOUNTER — Encounter: Payer: BLUE CROSS/BLUE SHIELD | Admitting: Family Medicine

## 2015-08-14 DIAGNOSIS — R7303 Prediabetes: Secondary | ICD-10-CM

## 2015-08-14 HISTORY — DX: Prediabetes: R73.03

## 2015-08-23 LAB — LIPID PANEL
Cholesterol: 209 mg/dL — ABNORMAL HIGH (ref 125–200)
HDL: 41 mg/dL (ref >=40)
LDL Cholesterol: 140 mg/dL — ABNORMAL HIGH (ref <130)
Total CHOL/HDL Ratio: 5.1 Ratio — ABNORMAL HIGH (ref <=5.0)
Triglycerides: 139 mg/dL (ref <150)
VLDL: 28 mg/dL (ref <30)

## 2015-08-23 LAB — COMPREHENSIVE METABOLIC PANEL
ALT: 31 U/L (ref 9–46)
AST: 27 U/L (ref 10–40)
Albumin: 4.8 g/dL (ref 3.6–5.1)
Alkaline Phosphatase: 70 U/L (ref 40–115)
BUN: 18 mg/dL (ref 7–25)
CO2: 28 mmol/L (ref 20–31)
Calcium: 9.7 mg/dL (ref 8.6–10.3)
Chloride: 103 mmol/L (ref 98–110)
Creat: 0.91 mg/dL (ref 0.60–1.35)
Glucose, Bld: 97 mg/dL (ref 65–99)
Potassium: 4.4 mmol/L (ref 3.5–5.3)
Sodium: 139 mmol/L (ref 135–146)
Total Bilirubin: 0.5 mg/dL (ref 0.2–1.2)
Total Protein: 7.4 g/dL (ref 6.1–8.1)

## 2015-08-23 LAB — HEMOGLOBIN A1C
Hgb A1c MFr Bld: 5.9 % — ABNORMAL HIGH (ref <5.7)
Mean Plasma Glucose: 123 mg/dL — ABNORMAL HIGH (ref <117)

## 2015-08-24 LAB — TSH: TSH: 1.416 u[IU]/mL (ref 0.350–4.500)

## 2015-08-28 ENCOUNTER — Encounter: Payer: Self-pay | Admitting: Family Medicine

## 2015-08-28 ENCOUNTER — Ambulatory Visit (INDEPENDENT_AMBULATORY_CARE_PROVIDER_SITE_OTHER): Payer: 59 | Admitting: Family Medicine

## 2015-08-28 VITALS — BP 120/72 | HR 80 | Resp 18 | Ht 69.5 in | Wt 180.0 lb

## 2015-08-28 DIAGNOSIS — R7303 Prediabetes: Secondary | ICD-10-CM

## 2015-08-28 DIAGNOSIS — R7302 Impaired glucose tolerance (oral): Secondary | ICD-10-CM

## 2015-08-28 DIAGNOSIS — F17210 Nicotine dependence, cigarettes, uncomplicated: Secondary | ICD-10-CM | POA: Diagnosis not present

## 2015-08-28 DIAGNOSIS — M5020 Other cervical disc displacement, unspecified cervical region: Secondary | ICD-10-CM

## 2015-08-28 DIAGNOSIS — E663 Overweight: Secondary | ICD-10-CM

## 2015-08-28 DIAGNOSIS — Z0000 Encounter for general adult medical examination without abnormal findings: Secondary | ICD-10-CM

## 2015-08-28 DIAGNOSIS — Z13220 Encounter for screening for lipoid disorders: Secondary | ICD-10-CM

## 2015-08-28 DIAGNOSIS — E785 Hyperlipidemia, unspecified: Secondary | ICD-10-CM
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
## 2015-08-28 NOTE — Patient Instructions (Signed)
F/u in 6 months, call if you need me sooner  Please work on changing eating habits, as blood sugar and cholesterol are too high  Quit date for stopping smoking is January 1, then you  Start the patches  Blood pressure is great  Thankful things are improving for you  All the best for 2017!  [facility], we consider it a privelige to serve you.

## 2015-08-31 ENCOUNTER — Encounter: Payer: Self-pay | Admitting: Family Medicine

## 2015-08-31 DIAGNOSIS — F17200 Nicotine dependence, unspecified, uncomplicated: Secondary | ICD-10-CM | POA: Insufficient documentation

## 2015-08-31 DIAGNOSIS — E785 Hyperlipidemia, unspecified: Secondary | ICD-10-CM | POA: Insufficient documentation

## 2015-08-31 DIAGNOSIS — E669 Obesity, unspecified: Secondary | ICD-10-CM | POA: Insufficient documentation

## 2015-08-31 DIAGNOSIS — Z0000 Encounter for general adult medical examination without abnormal findings: Secondary | ICD-10-CM | POA: Insufficient documentation

## 2015-08-31 DIAGNOSIS — R7303 Prediabetes: Secondary | ICD-10-CM | POA: Insufficient documentation

## 2015-08-31 HISTORY — DX: Nicotine dependence, unspecified, uncomplicated: F17.200

## 2015-08-31 NOTE — Assessment & Plan Note (Signed)
Annual exam as documented. Counseling done  re healthy lifestyle involving commitment to 150 minutes exercise per week, heart healthy diet, and attaining healthy weight.The importance of adequate sleep also discussed. Regular seat belt use and home safety, is also discussed. Changes in health habits are decided on by the patient with goals and time frames  set for achieving them. Immunization needs are specifically addressed at this visit.Patient declined flu vaccine

## 2015-08-31 NOTE — Assessment & Plan Note (Signed)
Patient counseled for approximately 5 minutes regarding the health risks of ongoing nicotine use, specifically all types of cancer, heart disease, stroke and respiratory failure. The options available for help with cessation ,the behavioral changes to assist the process, and the option to either gradully reduce usage  Or abruptly stop.is also discussed. Pt is also encouraged to set specific goals in number of cigarettes used daily, as well as to set a quit date.  Number of cigarettes/cigars currently smoking daily: 10 Quit date set for 09/14/2015, will start the patches again

## 2015-08-31 NOTE — Assessment & Plan Note (Signed)
Has residual rUE pain and weakness despite surgery, no desire for chronic pain management , and he is functional

## 2015-08-31 NOTE — Assessment & Plan Note (Signed)
Deteriorated. Patient re-educated about  the importance of commitment to a  minimum of 150 minutes of exercise per week.  The importance of healthy food choices with portion control discussed. Encouraged to start a food diary, count calories and to consider  joining a support group. Sample diet sheets offered. Goals set by the patient for the next several months.   Weight /BMI 08/28/2015 02/26/2014 06/18/2013  WEIGHT 180 lb 170 lb 170 lb 9.6 oz  HEIGHT 5' 9.5" 5' 11.5" 5' 11.5"  BMI 26.21 kg/m2 23.38 kg/m2 23.46 kg/m2    Current exercise per week 150 minutes.

## 2015-08-31 NOTE — Assessment & Plan Note (Signed)
Hyperlipidemia:Low fat diet discussed and encouraged.   Lipid Panel  Lab Results  Component Value Date   CHOL 209* 08/23/2015   HDL 41 08/23/2015   LDLCALC 140* 08/23/2015   TRIG 139 08/23/2015   CHOLHDL 5.1* 08/23/2015   Updated lab needed at/ before next visit.

## 2015-08-31 NOTE — Assessment & Plan Note (Signed)
Patient educated about the importance of limiting  Carbohydrate intake , the need to commit to daily physical activity for a minimum of 30 minutes , and to commit weight loss. The fact that changes in all these areas will reduce or eliminate all together the development of diabetes is stressed.   Diabetic Labs Latest Ref Rng 08/23/2015 11/20/2012 09/28/2011 10/03/2010 03/14/2009  HbA1c <5.7 % 5.9(H) - - - -  Chol 125 - 200 mg/dL 161(W209(H) 960(A230(H) [id number] -  HDL >=40 mg/dL 41 41 46 19(J38(L) -  Calc LDL <130 mg/dL 478(G140(H) 956(O171(H) 130(Q129(H) 657(Q133(H) -  Triglycerides <150 mg/dL 469139 90 99 629133 -  Creatinine 0.60 - 1.35 mg/dL 5.280.91 4.131.12 2.440.98 0.101.00 2.721.12   BP/Weight 08/28/2015 02/26/2014 06/18/2013 02/15/2013 11/20/2012 10/25/2011 10/14/2011  Systolic BP 120 - 109 104 118 536122 120  Diastolic BP 72 - 76 68 80 76 80  Wt. (Lbs) 180 170 170.6 182 191.4 189 189  BMI 26.21 23.38 23.46 25.03 27.87 27.52 27.52   No flowsheet data found. Follow up in 6 month

## 2016-08-01 IMAGING — MR MR CERVICAL SPINE W/O CM
4 of 5 series · 14 of 48 positions shown · non-contrast
Comparison: Preoperative cervical MRI 02/28/2014. Intraoperative
cervical spine images 7 [DATE].

CLINICAL DATA: 34-year-old female with cervical neck pain and right
arm numbness for 1 year. Cervical disc disease and prior spine
surgery. Subsequent encounter.

EXAM:
MRI CERVICAL SPINE WITHOUT CONTRAST
TECHNIQUE: Multiplanar, multisequence MR imaging of the cervical spine was
performed. No intravenous contrast was administered.

[Series 3: T2 · sagittal · 3.0mm · 0.40mm/px · 5 of 15 slices shown (1 of 2)]
[im 1/15]
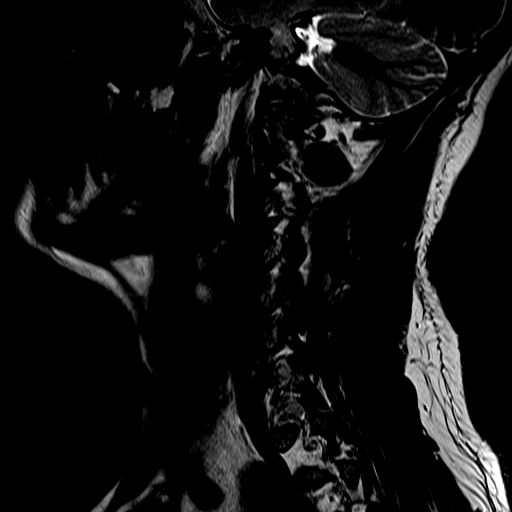
[im 3/15]
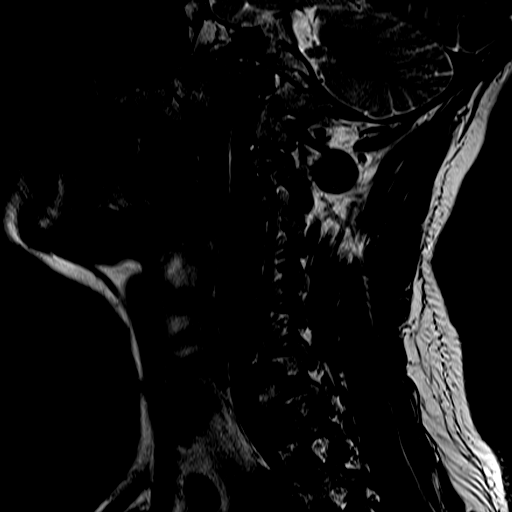
[im 5/15]
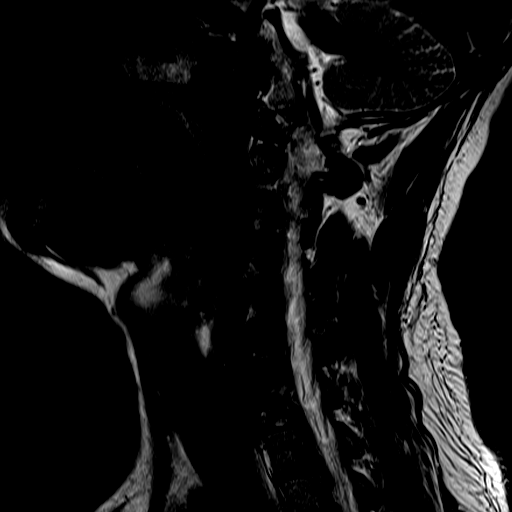
[im 8/15]
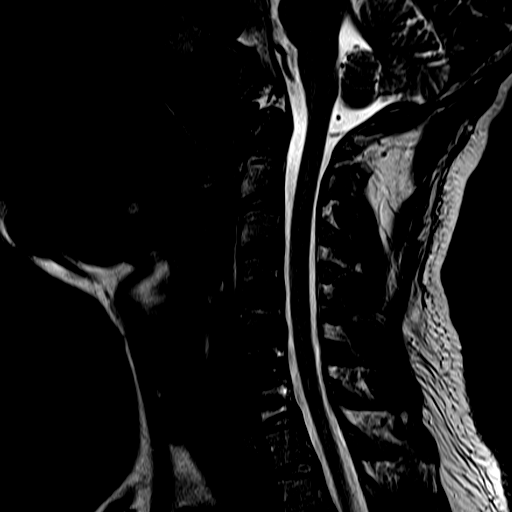
[im 12/15]
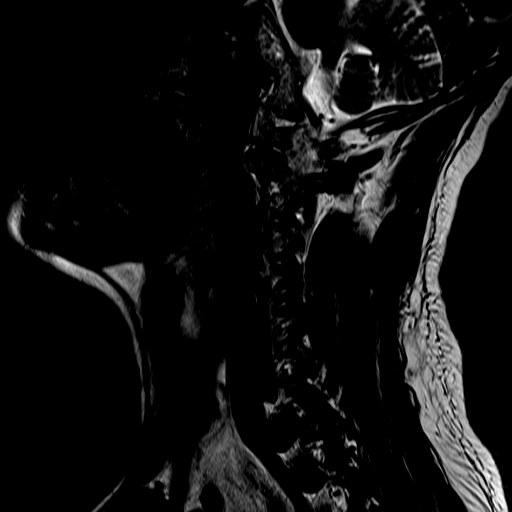

[Series 4: FLAIR · sagittal · 3.0mm · 0.46mm/px · 3 of 15 slices shown]
[im 3/15]
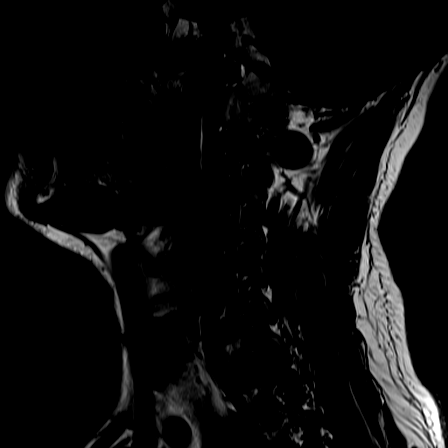
[im 8/15]
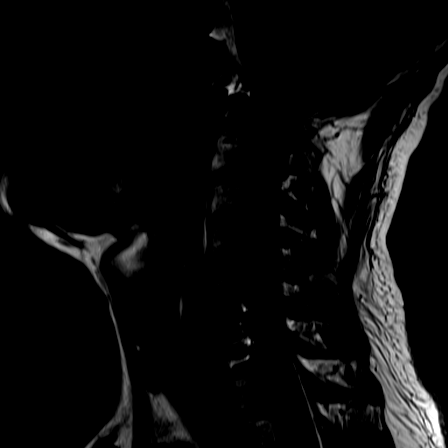
[im 12/15]
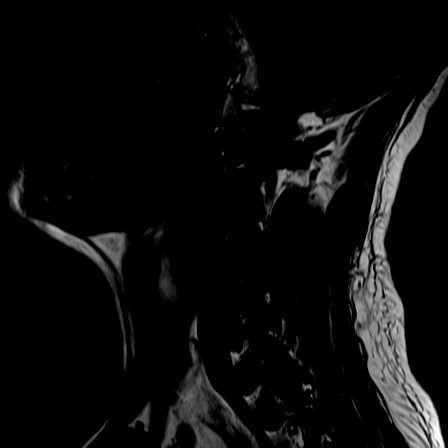

[Series 5: ir sagital · sagittal · 3.0mm · 0.23mm/px · 3 of 15 slices shown]
[im 3/15]
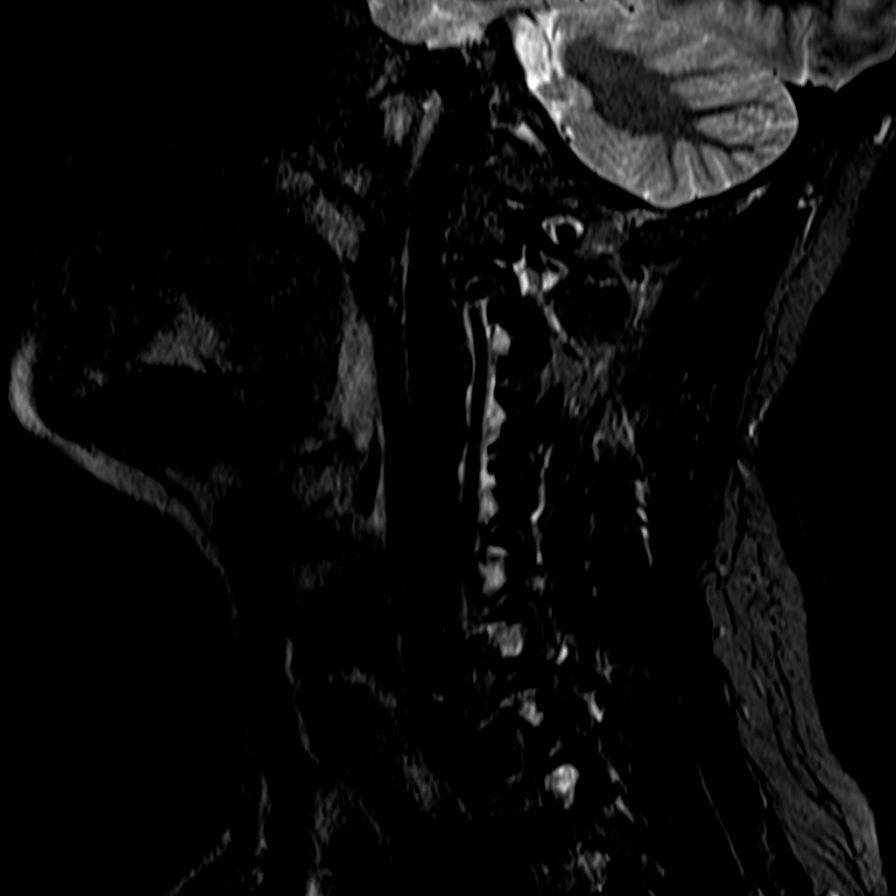
[im 9/15]
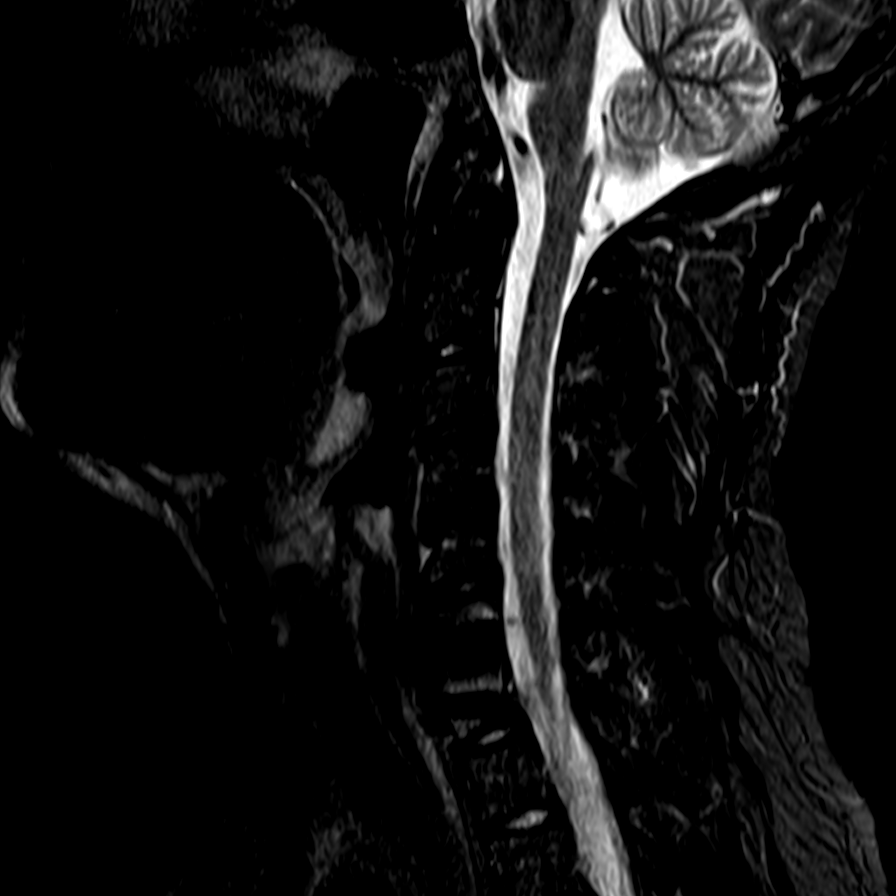
[im 15/15]
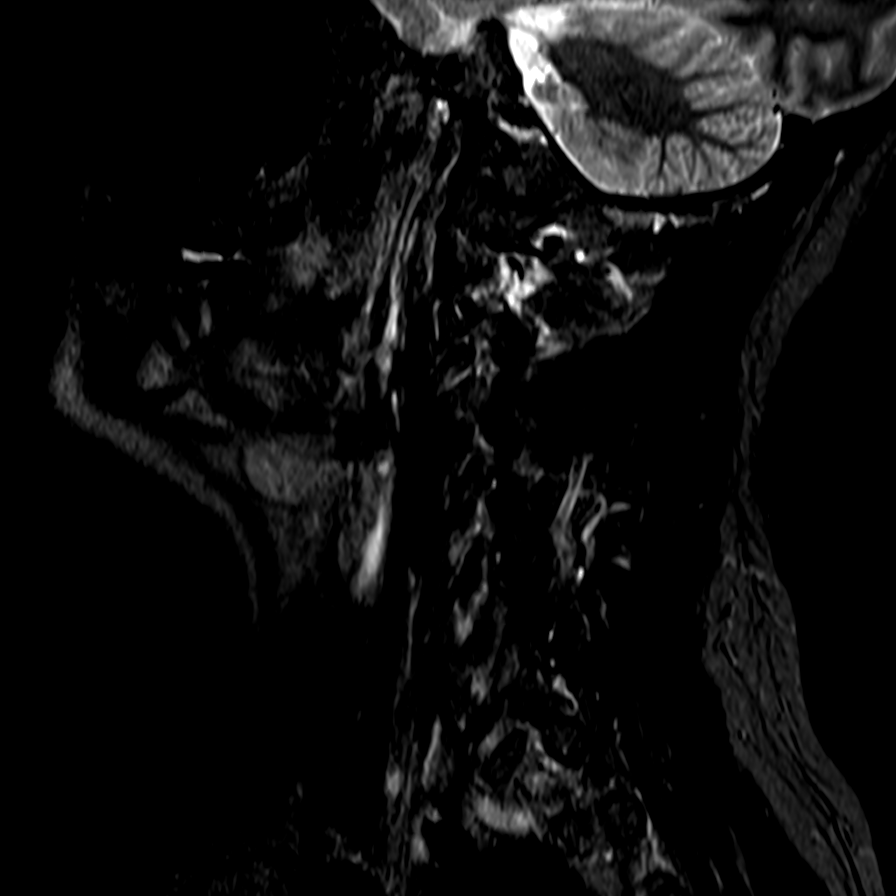

[Series 7: T2 · axial · 3.0mm · 0.22mm/px · z∈[-39,+31]mm · 3 of 34 slices shown (2 of 2)]
[im 6/34]
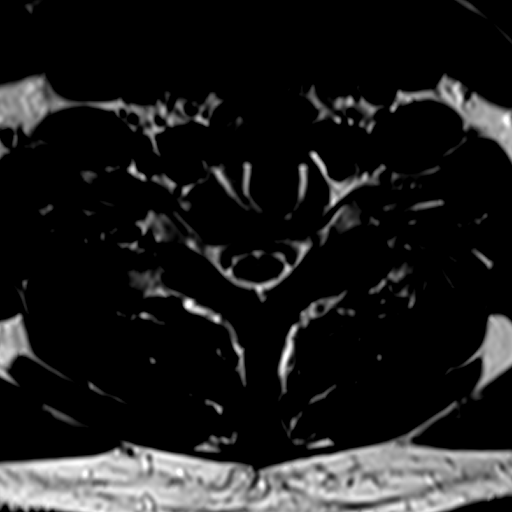
[im 18/34]
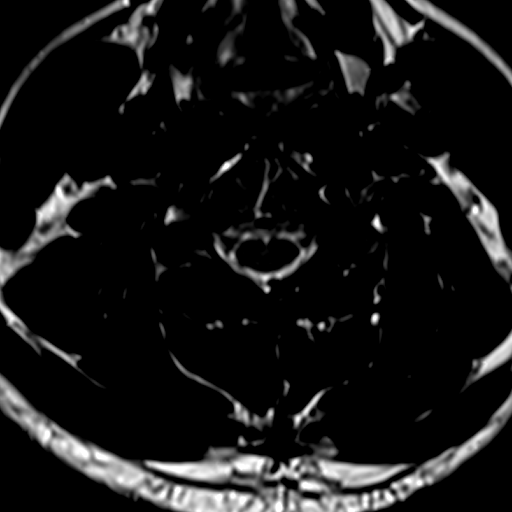
[im 28/34]
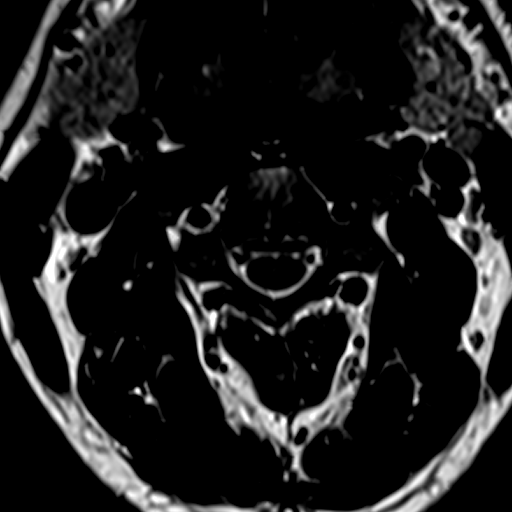

[14 of 48 positions shown; findings below may reference images not displayed]

FINDINGS: Interval C6-C7 ACDF. Mild associated hardware susceptibility
artifact. No marrow edema or evidence of acute osseous abnormality.

Cervicomedullary junction is within normal limits. Normal visualized
spinal cord signal and morphology. Negative paraspinal soft tissues.

C2-C3:  Negative.

C3-C4:  Negative.

C4-C5:  Minor disc bulge.  No stenosis.

C5-C6: Small central disc protrusion best seen on series 7, image
20. This appears mildly increased, but there is no subsequent
stenosis.

C6-C7: Interval ACDF. Thecal sac and foraminal patency now appears
normal. Residual uncovertebral hypertrophy.

C7-T1: Mild to moderate facet and uncovertebral hypertrophy is
stable to mildly increased, but there is no significant stenosis. No
upper thoracic spinal stenosis.
IMPRESSION: 1. Interval C6-C7 ACDF with no adverse features.
2. Small but increased central disc protrusion at C5-C6 and
increased facet and uncovertebral hypertrophy at C7-T1, but no
associated stenosis.

## 2016-10-29 ENCOUNTER — Encounter: Payer: Self-pay | Admitting: Orthopedic Surgery

## 2016-10-29 ENCOUNTER — Ambulatory Visit (INDEPENDENT_AMBULATORY_CARE_PROVIDER_SITE_OTHER): Payer: 59

## 2016-10-29 ENCOUNTER — Ambulatory Visit (INDEPENDENT_AMBULATORY_CARE_PROVIDER_SITE_OTHER): Payer: 59 | Admitting: Orthopedic Surgery

## 2016-10-29 VITALS — BP 122/77 | HR 84 | Temp 97.7°F | Ht 69.5 in | Wt 190.0 lb

## 2016-10-29 DIAGNOSIS — M659 Synovitis and tenosynovitis, unspecified: Secondary | ICD-10-CM

## 2016-10-29 DIAGNOSIS — M79644 Pain in right finger(s): Secondary | ICD-10-CM | POA: Diagnosis not present

## 2016-10-29 DIAGNOSIS — M25521 Pain in right elbow: Secondary | ICD-10-CM | POA: Diagnosis not present

## 2016-10-29 DIAGNOSIS — M7711 Lateral epicondylitis, right elbow: Secondary | ICD-10-CM

## 2016-10-29 MED ORDER — IBUPROFEN 800 MG PO TABS: 800.0000 mg | ORAL_TABLET | Freq: Three times a day (TID) | ORAL | 1 refills | Status: DC | PRN

## 2016-10-29 NOTE — Patient Instructions (Addendum)
WEAR tennis elbow brace for all heavy activity  Take ibuprofen 800 mg 3 times a day for 2 weeks and then as needed  Apply ice pack to right elbow at the end of the day for 20 minutes  Perform tennis elbow exercises for 6 weeks  If no improvement after 6 weeks please call the office for repeat evaluation   Tennis Elbow Tennis elbow (lateral epicondylitis) is inflammation of the outer tendons of your forearm close to your elbow. Your tendons attach your muscles to your bones. The outer tendons of your forearm are used to extend your wrist, and they attach on the outside part of your elbow. Tennis elbow is often found in people who play tennis, but anyone may get the condition from repeatedly extending the wrist or turning the forearm. What are the causes? This condition is caused by repeatedly extending your wrist and using your hands. It can result from sports or work that requires repetitive forearm movements. Tennis elbow may also be caused by an injury. What increases the risk? You have a higher risk of developing tennis elbow if you play tennis or another racquet sport. You also have a higher risk if you frequently use your hands for work. This condition is also more likely to develop in:  Musicians.  Carpenters, painters, and plumbers.  Cooks.  Cashiers.  People who work in [employer].  Holiday representative workers.  Butchers.  People who use computers. What are the signs or symptoms? Symptoms of this condition include:  Pain and tenderness in your forearm and the outer part of your elbow. You may only feel the pain when you use your arm, or you may feel it even when you are not using your arm.  A burning feeling that runs from your elbow through your arm.  Weak grip in your hands. How is this diagnosed? This condition may be diagnosed by medical history and physical exam. You may also have other tests, including:  X-rays.  MRI. How is this treated? Your health care  provider will recommend lifestyle adjustments, such as resting and icing your arm. Treatment may also include:  Medicines for inflammation. This may include shots of cortisone if your pain continues.  Physical therapy. This may include massage or exercises.  An elbow brace. Surgery may eventually be recommended if your pain does not go away with treatment. Follow these instructions at home: Activity  Rest your elbow and wrist as directed by your health care provider. Try to avoid any activities that caused the problem until your health care provider says that you can do them again.  If a physical therapist teaches you exercises, do all of them as directed.  If you lift an object, lift it with your palm facing upward. This lowers the stress on your elbow. Lifestyle  If your tennis elbow is caused by sports, check your equipment and make sure that:  You are using it correctly.  It is the best fit for you.  If your tennis elbow is caused by work, take breaks frequently, if you are able. Talk with your manager about how to best perform tasks in a way that is safe.  If your tennis elbow is caused by computer use, talk with your manager about any changes that can be made to your work environment. General instructions  If directed, apply ice to the painful area:  Put ice in a plastic bag.  Place a towel between your skin and the bag.  Leave the ice on  for 20 minutes, 2-3 times per day.  Take medicines only as directed by your health care provider.  If you were given a brace, wear it as directed by your health care provider.  Keep all follow-up visits as directed by your health care provider. This is important. Contact a health care provider if:  Your pain does not get better with treatment.  Your pain gets worse.  You have numbness or weakness in your forearm, hand, or fingers. This information is not intended to replace advice given to you by your health care provider. Make  sure you discuss any questions you have with your health care provider. Document Released: 08/30/2005 Document Revised: 04/29/2016 Document Reviewed: 08/26/2014 Elsevier Interactive Patient Education  2017 [employer]  Tenosynovitis Tenosynovitis is inflammation of a tendon and the sleeve of tissue that covers the tendon (tendon sheath). A tendon is a cord of tissue that connects muscle to bone. Normally, a tendon slides smoothly inside its tendon sheath. Tenosynovitis limits movement of the tendon and surrounding tissues, which may cause pain and stiffness. Tenosynovitis can affect any tendon and tendon sheath. Commonly affected areas include tendons in the:  Shoulder.  Arm.  Hand.  Hip.  Leg.  Foot. What are the causes? The main cause of this condition is wear and tear over time that results in slight tears in the tendon. Other possible causes include:  A sudden injury to the tendon or tendon sheath.  A disease that causes inflammation in the body.  An infection that spreads to the tendon and tendon sheath from a skin wound.  An infection in another part of the body that spreads to the tendon and tendon sheath through the blood. What increases the risk? The following factors may make you more likely to develop this condition:  Having rheumatoid arthritis, gout, or diabetes.  Using IV drugs.  Doing physical activities that can cause tendon overuse and stress.  Having gonorrhea. What are the signs or symptoms? Symptoms of this condition depend on the cause. Symptoms may include:  Pain with movement.  Pain and tenderness when pressing on the tendon and tendon sheath.  Swelling.  Stiffness. If tenosynovitis is caused by an infection, symptoms may also include:  Fever.  Redness.  Warmth. How is this diagnosed? This condition may be diagnosed based on your medical history and a physical exam. You also may have:  Blood tests.  Imaging tests, such  as:  MRI.  Ultrasound.  A sample of fluid removed from inside the tendon sheath to be checked in a lab. How is this treated? Treatment for this condition depends on the cause. If tenosynovitis is not caused by an infection, treatment may include:  Resting the tendon.  Keeping the tendon in place for periods of time (immobilization) in a splint, brace, or sling.  NSAIDs to reduce pain and swelling.  A shot (injection) of medicine to help reduce pain and swelling (steroid).  Icing or applying heat to the affected area.  Physical therapy.  Surgery to release the tendon in the sheath or to repair damage to the tendon or tendon sheath. Surgery may be done if other treatments do not help relieve symptoms. If tenosynovitis is caused by infection, treatment may include antibiotic medicine given through an IV. In some cases, surgery may be needed to drain fluid from the tendon sheath or to remove the tendon sheath. Follow these instructions at home: If you have a splint, brace, or sling:  Wear the splint, brace, or  sling as told by your health care provider. Remove it only as told by your health care provider.  Loosen the splint, brace, or sling if your fingers or toes tingle, become numb, or turn cold and blue.  Do not let your splint or brace get wet if it is not waterproof.  Do not take baths, swim, or use a hot tub until your health care provider approves. Ask your health care provider if you can take showers.  If your splint, brace, or sling is not waterproof, cover it with a watertight covering when you take a bath or a shower.  Keep the splint, brace, or sling clean. Managing pain, stiffness, and swelling  If directed, apply ice to the affected area.  Put ice in a plastic bag.  Place a towel between your skin and the bag.  Leave the ice on for 20 minutes, 2-3 times a day.  Move the fingers or toes of the affected limb often, if this applies. This can help to prevent  stiffness and lessen swelling.  If directed, raise (elevate) the affected area above the level of your heart while you are sitting or lying down.  If directed, apply heat to the affected area as often as told by your health care provider. Use the heat source that your health care provider recommends, such as a moist heat pack or a heating pad.  Place a towel between your skin and the heat source.  Leave the heat on for 20-30 minutes.  Remove the heat if your skin turns bright red. This is especially important if you are unable to feel pain, heat, or cold. You may have a greater risk of getting burned. Driving  Do not drive or operate heavy machinery while taking prescription pain medicine.  Ask your health care provider when it is safe to drive if you have a splint or brace on any part of your arm or leg. Activity  Return to your normal activities as told by your health care provider. Ask your health care provider what activities are safe for you.  Rest the affected area as told by your health care provider.  Avoid using the affected area while you are having symptoms of tenosynovitis.  If physical therapy was prescribed, do exercises as told by your health care provider. Safety  Do not use the injured limb to support your body weight until your health care provider says that you can. General instructions  Take over-the-counter and prescription medicines only as told by your health care provider.  Keep all follow-up visits as told by your health care provider. This is important. Contact a health care provider if:  Your symptoms are not improving or are getting worse.  You have a fever and more of any of the following symptoms:  Pain.  Redness.  Warmth.  Swelling. Get help right away if:  Your fingers or toes become numb or turn blue. This information is not intended to replace advice given to you by your health care provider. Make sure you discuss any questions you  have with your health care provider. Document Released: 08/30/2005 Document Revised: 04/29/2016 Document Reviewed: 07/09/2015 Elsevier Interactive Patient Education  2017 [employer].

## 2016-10-29 NOTE — Progress Notes (Signed)
Patient ID: Isaac Hubbard, male   DOB: [birth year], 36 y.o.   MRN: [id number]  Chief Complaint  Patient presents with  . Elbow Pain    RIGHT ELBOW PAIN  . Hand Pain    RIGHT INDEX FINGER PAIN    HPI Isaac Hubbard is a 36 y.o. male.  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 As a dull aching feeling it radiates a little proximal and distal it is mild to moderately severe and is associated with picking up objects and holding them away from the body  Review of Systems Review of Systems  Constitutional: Negative for fever.  Skin: Negative for rash.  Neurological: Negative for weakness and numbness.   (2 MINIMUM)  Past Medical History:  Diagnosis Date  . Hyperlipidemia   . Prediabetes 08/2015   lifestyle/ dietary change    Past Surgical History:  Procedure Laterality Date  . SPINE SURGERY  2015   c spine    Social History Social History  Substance Use Topics  . Smoking status: Current Every Day Smoker    Packs/day: 0.50  . Smokeless tobacco: Not on file  . Alcohol use No    No Known Allergies  No outpatient prescriptions have been marked as taking for the 10/29/16 encounter (Office Visit) with Isaac HearingStanley E Annice Jolly, MD.      Physical Exam Physical Exam 1.BP 122/77   Pulse 84   Temp 97.7 F (36.5 C)   Ht 5' 9.5" (1.765 m)   Wt 190 lb (86.2 kg)   BMI 27.66 kg/m   2. Gen. appearance. The patient is well-developed and well-nourished, grooming and hygiene are normal. There are no gross congenital abnormalities  3. The patient is alert and oriented to person place and time  4. Mood and affect are normal  5. Ambulation Normal walking  Examination reveals the following: 6. On inspection we find tenderness over the right lateral epicondyles as well as the volar aspect of  the right index finger at the A1 pulley  7. With the range of motion of  right elbow normal right index finger normal  8. Stability tests were normal  and the elbow and the finger  9. Strength tests revealed grade 5 motor strength  10. Skin we find no rash ulceration or erythema  11. Sensation remains intact  12 Vascular system shows no peripheral edema  Provocative tests for tennis elbow positive  MEDICAL DECISION MAKING:    Data Reviewed Imaging of the right elbow Normal x-ray right elbow  Assessment Right tennis elbow and right index finger tenosynovitis Encounter Diagnoses  Name Primary?  . Right elbow pain Yes  . Finger pain, right   . Right tennis elbow   . Flexor tenosynovitis of finger      Plan Recommend ibuprofen 800 mg 3 times a day for 2 weeks then as needed Super 7 exercises for tennis elbow Tennis elbow brace for 6 weeks for all activity  Follow-up as needed  Isaac Hubbard 10/29/2016, 9:08 AM

## 2016-12-06 ENCOUNTER — Encounter: Payer: Self-pay | Admitting: Orthopedic Surgery

## 2016-12-06 ENCOUNTER — Ambulatory Visit (INDEPENDENT_AMBULATORY_CARE_PROVIDER_SITE_OTHER): Payer: 59 | Admitting: Orthopedic Surgery

## 2016-12-06 DIAGNOSIS — M25521 Pain in right elbow: Secondary | ICD-10-CM

## 2016-12-06 DIAGNOSIS — M7711 Lateral epicondylitis, right elbow: Secondary | ICD-10-CM | POA: Diagnosis not present

## 2016-12-06 NOTE — Patient Instructions (Signed)
BRACE X 4 WEEKS   ICE EVERY NIGHT FOR 20 MIN   RETURN IN 4 WEEKS

## 2016-12-06 NOTE — Progress Notes (Signed)
FOLLOW UP VISIT   Patient ID: Isaac MassonSi Isaac Hubbard, male   DOB: [birth year], 36 y.o.   MRN: [id number]  Chief Complaint  Patient presents with  . Follow-up    RIGHT ELBOW PAIN    HPI Isaac Hubbard is Isaac 36 y.o. male.   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 As Isaac dull aching feeling it radiates Isaac little proximal and distal it is mild to moderately severe and is associated with picking up objects and holding them away from the body  Review of Systems Review of Systems  No neck pain. No radicular symptoms specifically asked.   Physical Exam  He has tenderness over his right elbow is quite severe actually. He has full range of motion. Elbow is stable muscle strength and tone normal. Wrist extension against resistance causes pain over the elbow skin is intact pulses are good sensation is normal   MEDICAL DECISION MAKING  DATA     No diagnosis found.    PLAN(RISK)   Continue tennis elbow bracing, ice it night. I injected the elbow  Come back 4 weeks  Procedure note injection for right tennis elbow   Diagnosis right tennis elbow  Anesthesia ethyl chloride was used Alcohol use is clean the skin  After we obtained verbal consent and timeout Isaac 25-gauge needle was used to inject 40 mg of Depo-Medrol and 3 cc of 1% lidocaine just distal to the insertion of the ECRB  There were no complications and Isaac sterile bandage was applied.

## 2017-01-07 ENCOUNTER — Ambulatory Visit: Payer: 59 | Admitting: Orthopedic Surgery

## 2018-08-23 ENCOUNTER — Encounter (HOSPITAL_COMMUNITY): Payer: Self-pay | Admitting: *Deleted

## 2018-08-23 ENCOUNTER — Encounter (HOSPITAL_COMMUNITY)
Admission: AD | Disposition: A | Discharge status: Home / Self Care | Payer: Self-pay | Source: Ambulatory Visit | Attending: Internal Medicine

## 2018-08-23 ENCOUNTER — Telehealth: Payer: Self-pay

## 2018-08-23 ENCOUNTER — Ambulatory Visit (HOSPITAL_COMMUNITY)
Admission: AD | Admit: 2018-08-23 | Discharge: 2018-08-23 | Disposition: A | Discharge status: Home / Self Care | Payer: 59 | Source: Ambulatory Visit | Attending: Internal Medicine | Admitting: Internal Medicine

## 2018-08-23 ENCOUNTER — Other Ambulatory Visit: Payer: Self-pay

## 2018-08-23 ENCOUNTER — Other Ambulatory Visit: Payer: Self-pay | Admitting: Internal Medicine

## 2018-08-23 DIAGNOSIS — R7303 Prediabetes: Secondary | ICD-10-CM | POA: Diagnosis not present

## 2018-08-23 DIAGNOSIS — F17210 Nicotine dependence, cigarettes, uncomplicated: Secondary | ICD-10-CM | POA: Diagnosis not present

## 2018-08-23 DIAGNOSIS — K2210 Ulcer of esophagus without bleeding: Secondary | ICD-10-CM | POA: Diagnosis not present

## 2018-08-23 DIAGNOSIS — E785 Hyperlipidemia, unspecified: Secondary | ICD-10-CM | POA: Diagnosis not present

## 2018-08-23 DIAGNOSIS — K209 Esophagitis, unspecified: Secondary | ICD-10-CM | POA: Insufficient documentation

## 2018-08-23 DIAGNOSIS — R1310 Dysphagia, unspecified: Secondary | ICD-10-CM | POA: Insufficient documentation

## 2018-08-23 HISTORY — PX: ESOPHAGOGASTRODUODENOSCOPY: SHX5428

## 2018-08-23 HISTORY — PX: BIOPSY: SHX5522

## 2018-08-23 SURGERY — EGD (ESOPHAGOGASTRODUODENOSCOPY)
Anesthesia: Moderate Sedation

## 2018-08-23 MED ORDER — SODIUM CHLORIDE 0.9 % IV SOLN
INTRAVENOUS | Status: DC
  Administered 2018-08-23: 1000 mL via INTRAVENOUS

## 2018-08-23 MED ORDER — STERILE WATER FOR IRRIGATION IR SOLN
Status: DC | PRN
  Administered 2018-08-23: 1.5 mL

## 2018-08-23 MED ORDER — ONDANSETRON HCL 4 MG/2ML IJ SOLN
INTRAMUSCULAR | Status: AC
  Filled 2018-08-23: qty 2

## 2018-08-23 MED ORDER — LIDOCAINE VISCOUS HCL 2 % MT SOLN
OROMUCOSAL | Status: AC
  Filled 2018-08-23: qty 15

## 2018-08-23 MED ORDER — LIDOCAINE VISCOUS HCL 2 % MT SOLN
OROMUCOSAL | Status: DC | PRN
  Administered 2018-08-23: 1 via OROMUCOSAL

## 2018-08-23 MED ORDER — MEPERIDINE HCL 100 MG/ML IJ SOLN
INTRAMUSCULAR | Status: DC | PRN
  Administered 2018-08-23: 10 mg
  Administered 2018-08-23: 25 mg
  Administered 2018-08-23: 15 mg

## 2018-08-23 MED ORDER — ONDANSETRON HCL 4 MG/2ML IJ SOLN
INTRAMUSCULAR | Status: DC | PRN
  Administered 2018-08-23: 4 mg via INTRAVENOUS

## 2018-08-23 MED ORDER — MEPERIDINE HCL 50 MG/ML IJ SOLN
INTRAMUSCULAR | Status: AC
  Filled 2018-08-23: qty 1

## 2018-08-23 MED ORDER — MIDAZOLAM HCL 5 MG/5ML IJ SOLN
INTRAMUSCULAR | Status: AC
  Filled 2018-08-23: qty 10

## 2018-08-23 MED ORDER — MIDAZOLAM HCL 5 MG/5ML IJ SOLN
INTRAMUSCULAR | Status: DC | PRN
  Administered 2018-08-23: 1 mg via INTRAVENOUS
  Administered 2018-08-23: 2 mg via INTRAVENOUS
  Administered 2018-08-23 (×2): 1 mg via INTRAVENOUS
  Administered 2018-08-23: 2 mg via INTRAVENOUS
  Administered 2018-08-23: 1 mg via INTRAVENOUS

## 2018-08-23 NOTE — Telephone Encounter (Signed)
I spoke with the pt- he is complaining of severe upper abd pain and burning when he eats or drinks, he said it feels like it is worse when it gets to the bottom of his throat and it feels like it gets stuck there before it goes into his stomach. No N/V, no fever. Pt is not taking any meds. He did try 2 tums but that didn't help. He went to the urgent care yesterday but he stated they told him he needed to see GI. This problem has been going on for 2-3 days. He said he is ok as long as he doesn't eat or drink and it hurts worse when he lays down. He has only had 1/2 an egg, a bite of a piece of bacon and a small glass of tea early this morning. He got it down but it was extremely painful.  Advised the pt that I will speak with Dr.Rourk and let him know his recommendations. Pt is aware to not eat or drink anything else until he hears from us.  Pt stated he is 2.5 hours away but if Dr.Rourk needs him he will come straight home and can be here after lunch.   Pt phone number is [phone]

## 2018-08-23 NOTE — Op Note (Signed)
[facility] Patient Name: Isaac Hubbard Procedure Date: 08/23/2018 12:34 PM MRN: [id number] Date of Birth: 1980-09-30 Attending MD: Gennette Pac , MD CSN: [id number] Age: 37 Admit Type: Outpatient Procedure:                Upper GI endoscopy Indications:              Odynophagia Providers:                Gennette Pac, MD, Sterling Big, RN,                            Dyann Ruddle Referring MD:             Milus Mallick. Simpson MD, MD Medicines:                Midazolam 8 mg IV, Meperidine 50 mg IV Complications:            No immediate complications. Estimated Blood Loss:     Estimated blood loss was minimal. Procedure:                Pre-Anesthesia Assessment:                           - Prior to the procedure, a History and Physical                            was performed, and patient medications and                            allergies were reviewed. The patient's tolerance of                            previous anesthesia was also reviewed. The risks                            and benefits of the procedure and the sedation                            options and risks were discussed with the patient.                            All questions were answered, and informed consent                            was obtained. Prior Anticoagulants: The patient has                            taken no previous anticoagulant or antiplatelet                            agents. ASA Grade Assessment: II - A patient with                            mild systemic disease. After reviewing the risks  and benefits, the patient was deemed in                            satisfactory condition to undergo the procedure.                           After obtaining informed consent, the endoscope was                            passed under direct vision. Throughout the                            procedure, the patient's blood pressure, pulse, and                             oxygen saturations were monitored continuously. The                            GIF-H190 ([id number]) scope was introduced through the                            mouth, and advanced to the second part of duodenum.                            The upper GI endoscopy was accomplished without                            difficulty. The patient tolerated the procedure                            well. Scope In: 1:03:42 PM Scope Out: 1:15:22 PM Total Procedure Duration: 0 hours 11 minutes 40 seconds  Findings:      Esophagitis was found. 5cm cirmfernital band of abnormal esophagus - mid       body - denudeded / granular appearing mucosa; no plaques; no FB.      This was biopsied with a cold forceps for histology. Estimated blood       loss was minimal.      The entire examined stomach was normal.      The duodenal bulb and second portion of the duodenum were normal. Impression:               - Esophagitis - likely viral in etiology vs ocult                            chemical injury vs. transient FB (impaction) less                            likely - biopsied                           Mild reflux esophaitias.                           - Normal stomach.                           -  Normal duodenal bulb and second portion of the                            duodenum. Moderate Sedation:      Moderate (conscious) sedation was administered by the endoscopy nurse       and supervised by the endoscopist. The following parameters were       monitored: oxygen saturation, heart rate, blood pressure, respiratory       rate, EKG, adequacy of pulmonary ventilation, and response to care.       Total physician intraservice time was 23 minutes. Recommendation:           - Patient has a contact number available for                            emergencies. The signs and symptoms of potential                            delayed complications were discussed with the                            patient. Return to normal  activities tomorrow.                            Written discharge instructions were provided to the                            patient.                           - Full liquid diet.                           - Continue present medications. Carafate suspenison                            QID. Protonix 40 mg daily. Chloreseptic spray as                            needed                           - No repeat upper endoscopy. F/U path                           - Return to GI office (date not yet determined). Procedure Code(s):        --- Professional ---                           [id number], Esophagogastroduodenoscopy, flexible,                            transoral; with biopsy, single or multiple                           99153, Moderate sedation; each additional 15  minutes intraservice time                           G0500, Moderate sedation services provided by the                            same physician or other qualified health care                            professional performing a gastrointestinal                            endoscopic service that sedation supports,                            requiring the presence of an independent trained                            observer to assist in the monitoring of the                            patient's level of consciousness and physiological                            status; initial 15 minutes of intra-service time;                            patient age 54 years or older (additional time may                            be reported with 29562, as appropriate) Diagnosis Code(s):        --- Professional ---                           K20.9, Esophagitis, unspecified                           R13.10, Dysphagia, unspecified CPT copyright 2018 American Medical Association. All rights reserved. The codes documented in this report are preliminary and upon coder review may  be revised to meet current compliance  requirements. Gerrit Friends. Leronda Lewers, MD Gennette Pac, MD 08/23/2018 1:33:45 PM This report has been signed electronically. Number of Addenda: 0

## 2018-08-23 NOTE — H&P (Signed)
 @LOGO @   Primary Care Physician:  Kerri PerchesSimpson, Margaret E, MD Primary Gastroenterologist:  Dr. Jena Gaussourk  Pre-Procedure History & Physical: HPI:  Isaac Hubbard is a 37 y.o. male here for urgent evaluation.  2-day history of progressing odynophagia and some perceived dysphagia.  Started after he ate a popsicle 2 days ago.  He symptoms pretty much subsided when he is not trying to swallow or consume solid food.  He does not have a history of chronic GERD or dysphagia.  Denies any unusual intake or perception that he ingested a foreign body recently.  No prior GI illness.  He is on no over-the-counter or prescription medications at this time only takes ibuprofen rarely but none in the past several weeks.  No antibiotics.  Denies chronic illnesses.  No alcohol or illicit drugs.  Saw Dr. Luna GlasgowGuraino at  urgent care.  He was referred here. No difficulties in controlling his secretions whatsoever.  He has not had any actual vomiting.  There has had difficulties with liquids and solids over the past 48 hours, things have gone down.  Past Medical History:  Diagnosis Date  . Hyperlipidemia   . Prediabetes 08/2015   lifestyle/ dietary change    Past Surgical History:  Procedure Laterality Date  . APPENDECTOMY    . SPINE SURGERY  2015   c spine  . TONSILLECTOMY      Prior to Admission medications   Medication Sig Start Date End Date Taking? Authorizing Provider  ibuprofen (ADVIL,MOTRIN) 800 MG tablet Take 1 tablet (800 mg total) by mouth every 8 (eight) hours as needed. 10/29/16   Vickki HearingHarrison, Stanley E, MD    Allergies as of 08/23/2018  . (No Known Allergies)    Family History  Problem Relation Age of Onset  . Diabetes Father   . Hypertension Father   . Arthritis Father   . Hypertension Paternal Grandmother     Social History   Socioeconomic History  . Marital status: Single    Spouse name: Not on file  . Number of children: Not on file  . Years of education: Not on file  . Highest education  level: Not on file  Occupational History  . Not on file  Social Needs  . Financial resource strain: Not on file  . Food insecurity:    Worry: Not on file    Inability: Not on file  . Transportation needs:    Medical: Not on file    Non-medical: Not on file  Tobacco Use  . Smoking status: Current Every Day Smoker    Packs/day: 0.50  . Smokeless tobacco: Never Used  Substance and Sexual Activity  . Alcohol use: No  . Drug use: No  . Sexual activity: Yes  Lifestyle  . Physical activity:    Days per week: Not on file    Minutes per session: Not on file  . Stress: Not on file  Relationships  . Social connections:    Talks on phone: Not on file    Gets together: Not on file    Attends religious service: Not on file    Active member of club or organization: Not on file    Attends meetings of clubs or organizations: Not on file    Relationship status: Not on file  . Intimate partner violence:    Fear of current or ex partner: Not on file    Emotionally abused: Not on file    Physically abused: Not on file    Forced sexual  activity: Not on file  Other Topics Concern  . Not on file  Social History Narrative  . Not on file    Review of Systems: See HPI, otherwise negative ROS  Physical Exam: BP 109/69   Pulse 62   Temp 98.4 F (36.9 C) (Oral)   Resp 15   Ht 5\' 11"  (1.803 m)   Wt 74.8 kg   SpO2 99%   BMI 23.01 kg/m  General:   Alert,  Well-developed, well-nourished, pleasant and cooperative in NAD Neck:  Supple; no masses or thyromegaly. No significant cervical adenopathy. Lungs:  Clear throughout to auscultation.   No wheezes, crackles, or rhonchi. No acute distress. Heart:  Regular rate and rhythm; no murmurs, clicks, rubs,  or gallops. Abdomen: Non-distended, normal bowel sounds.  Soft and nontender without appreciable mass or hepatosplenomegaly.  Pulses:  Normal pulses noted. Extremities:  Without clubbing or edema.  Impression/Plan: 37 year old gentleman with  relatively acute onset what is primarily odynophagia and some perceived dysphagia.  Acute onset of symptoms without obvious inciting event.  At this point, the differential includes and ingested occult foreign body, esophagitis (viral and or fungal).  No history consistent with pill induced injury.  Occult GERD with flare in the differential but also I feel is less likely.   Recommendations: Proceed with a diagnostic EGD today.  The risks, benefits, limitations, alternatives and imponderables have been reviewed with the patient. Potential for esophageal dilation, biopsy, etc. have also been reviewed.  Questions have been answered. All parties agreeable.     Notice: This dictation was prepared with Dragon dictation along with smaller phrase technology. Any transcriptional errors that result from this process are unintentional and may not be corrected upon review.

## 2018-08-23 NOTE — Discharge Instructions (Signed)
EGD Discharge instructions Please read the instructions outlined below and refer to this sheet in the next few weeks. These discharge instructions provide you with general information on caring for yourself after you leave the hospital. Your doctor may also give you specific instructions. While your treatment has been planned according to the most current medical practices available, unavoidable complications occasionally occur. If you have any problems or questions after discharge, please call your doctor. ACTIVITY  You may resume your regular activity but move at a slower pace for the next 24 hours.   Take frequent rest periods for the next 24 hours.   Walking will help expel (get rid of) the air and reduce the bloated feeling in your abdomen.   No driving for 24 hours (because of the anesthesia (medicine) used during the test).   You may shower.   Do not sign any important legal documents or operate any machinery for 24 hours (because of the anesthesia used during the test).  NUTRITION  Drink plenty of fluids.   You may resume your normal diet.   Begin with a light meal and progress to your normal diet.   Avoid alcoholic beverages for 24 hours or as instructed by your caregiver.  MEDICATIONS  You may resume your normal medications unless your caregiver tells you otherwise.  WHAT YOU CAN EXPECT TODAY  You may experience abdominal discomfort such as a feeling of fullness or gas pains.  FOLLOW-UP  Your doctor will discuss the results of your test with you.  SEEK IMMEDIATE MEDICAL ATTENTION IF ANY OF THE FOLLOWING OCCUR:  Excessive nausea (feeling sick to your stomach) and/or vomiting.   Severe abdominal pain and distention (swelling).   Trouble swallowing.   Temperature over 101 F (37.8 C).   Rectal bleeding or vomiting of blood.    Full liquid diet for the next 5 days  Protonix 40 mg once daily  Carafate suspension 1 g orally x5 days  May also use  Chloraseptic spray up to 4 times a day as needed for throat/chest pain  Work excuse for today, tomorrow and the 13th  Further recommendations to follow when I get the pathology report back.      Full Liquid Diet A full liquid diet may be used:  To help you transition from a clear liquid diet to a soft diet.  When your body is healing and can only tolerate foods that are easy to digest.  Before or after certain a procedure, test, or surgery (such as stomach or intestinal surgeries).  If you have trouble swallowing or chewing.  A full liquid diet includes fluids and foods that are liquid or will become liquid at room temperature. The full liquid diet gives you the proteins, fluids, salts, and minerals that you need for energy. If you continue this diet for more than 72 hours, talk to your health care provider about how many calories you need to consume. If you continue the diet for more than 5 days, talk to your health care provider about taking a multivitamin or a nutritional supplement. What do I need to know about a full liquid diet?  You may have any liquid.  You may have any food that becomes a liquid at room temperature. The food is considered a liquid if it can be poured off a spoon at room temperature.  Drink one serving of citrus or vitamin C-enriched fruit juice daily. What foods can I eat? Grains Any grain food that can be pureed in  soup (such as crackers, pasta, and rice). Hot cereal (such as farina or oatmeal) that has been blended. Talk to your health care provider or dietitian about these foods. Vegetables Pulp-free tomato or vegetable juice. Vegetables pureed in soup. Fruits Fruit juice, including nectars and juices with pulp. Meats and Other Protein Sources Eggs in custard, eggnog mix, and eggs used in ice cream or pudding. Strained meats, like in baby food, may be allowed. Consult your health care provider. Dairy Milk and milk-based beverages, including milk  shakes and instant breakfast mixes. Smooth yogurt. Pureed cottage cheese. Avoid these foods if they are not well tolerated. Beverages All beverages, including liquid nutritional supplements. Ask your health care provider if you can have carbonated beverages. They may not be well tolerated. Condiments Iodized salt, pepper, spices, and flavorings. Cocoa powder. Vinegar, ketchup, yellow mustard, smooth sauces (such as hollandaise, cheese sauce, or white sauce), and soy sauce. Sweets and Desserts Custard, smooth pudding. Flavored gelatin. Tapioca, junket. Plain ice cream, sherbet, fruit ices. Frozen ice pops, frozen fudge pops, pudding pops, and other frozen bars with cream. Syrups, including chocolate syrup. Sugar, honey, jelly. Fats and Oils Margarine, butter, cream, sour cream, and oils. Other Broth and cream soups. Strained, broth-based soups. The items listed above may not be a complete list of recommended foods or beverages. Contact your dietitian for more options. What foods can I not eat? Grains All breads. Grains are not allowed unless they are pureed into soup. Vegetables Vegetables are not allowed unless they are juiced, or cooked and pureed into soup. Fruits Fruits are not allowed unless they are juiced. Meats and Other Protein Sources Any meat or fish. Cooked or raw eggs. Nut butters. Dairy Cheese. Condiments Stone ground mustards. Fats and Oils Fats that are coarse or chunky. Sweets and Desserts Ice cream or other frozen desserts that have any solids in them or on top, such as nuts, chocolate chips, and pieces of cookies. Cakes. Cookies. Candy. Others Soups with chunks or pieces in them. The items listed above may not be a complete list of foods and beverages to avoid. Contact your dietitian for more information. This information is not intended to replace advice given to you by your health care provider. Make sure you discuss any questions you have with your health care  provider. Document Released: 08/30/2005 Document Revised: 02/05/2016 Document Reviewed: 07/05/2013 Elsevier Interactive Patient Education  2017 [employer].

## 2018-08-23 NOTE — Telephone Encounter (Signed)
I called [employer] and spoke with Steward DroneBrenda B. to obtain an authorization for an urgent EGD the approval [id number].

## 2018-08-23 NOTE — Telephone Encounter (Signed)
He needs an EGD today. Remain nothing by mouth. Please book him on the schedule for EGD conscious sedation today. Have him come this way now. We will get him done -- we will work him in.

## 2018-08-23 NOTE — Telephone Encounter (Signed)
Pt is aware. He is on his way home and will get his mom and come to the hospital. He is aware to not eat or drink anything and he is aware that he will need someone to take him home. I spoke with Shawna OrleansMelanie and she said to just put the orders in and they will work him in when he gets there.

## 2018-08-23 NOTE — Progress Notes (Signed)
Please excuse Isaac Hubbard from work on 08/23/2018, 08/24/2018, and Friday 08/25/2018. He cannot drive, operate heavy machinery or sign legal documents for 24 hours.

## 2018-08-23 NOTE — Telephone Encounter (Signed)
Orders for EGD put in for today.

## 2018-08-25 ENCOUNTER — Encounter: Payer: Self-pay | Admitting: Internal Medicine

## 2018-08-25 ENCOUNTER — Ambulatory Visit: Payer: 59 | Admitting: Nurse Practitioner

## 2018-08-29 ENCOUNTER — Encounter (HOSPITAL_COMMUNITY): Payer: Self-pay | Admitting: Internal Medicine

## 2019-02-27 ENCOUNTER — Other Ambulatory Visit: Payer: Self-pay | Admitting: Internal Medicine

## 2019-03-22 ENCOUNTER — Other Ambulatory Visit: Payer: 59

## 2019-03-22 ENCOUNTER — Other Ambulatory Visit: Payer: Self-pay

## 2019-03-22 DIAGNOSIS — Z20822 Contact with and (suspected) exposure to covid-19: Secondary | ICD-10-CM

## 2019-03-26 LAB — NOVEL CORONAVIRUS, NAA: SARS-CoV-2, NAA: NOT DETECTED

## 2019-08-07 ENCOUNTER — Other Ambulatory Visit: Payer: Self-pay

## 2019-08-07 DIAGNOSIS — Z20822 Contact with and (suspected) exposure to covid-19: Secondary | ICD-10-CM

## 2019-08-08 LAB — NOVEL CORONAVIRUS, NAA: SARS-CoV-2, NAA: NOT DETECTED

## 2019-08-10 ENCOUNTER — Telehealth: Payer: Self-pay | Admitting: General Practice

## 2019-08-10 NOTE — Telephone Encounter (Signed)
Patient is calling to receive negative COVID results. Patient expressed understanding. °

## 2020-04-14 ENCOUNTER — Telehealth: Payer: Self-pay | Admitting: Internal Medicine

## 2020-04-14 NOTE — Telephone Encounter (Signed)
Pt would like a refill of Pantoprazole 40 mg daily sent to his pharmacy.

## 2020-04-14 NOTE — Telephone Encounter (Signed)
PATIENT REFLUX MEDICATION NEEDS TO BE SENT TO [facility] IN [place]

## 2020-04-15 MED ORDER — PANTOPRAZOLE SODIUM 40 MG PO TBEC: 40.0000 mg | DELAYED_RELEASE_TABLET | Freq: Every day | ORAL | 11 refills | Status: DC

## 2020-04-15 MED ORDER — PANTOPRAZOLE SODIUM 40 MG PO TBEC: 40.0000 mg | DELAYED_RELEASE_TABLET | Freq: Every day | ORAL | 3 refills | Status: DC

## 2020-04-15 NOTE — Addendum Note (Signed)
Addended by: Tiffany Kocher on: 04/15/2020 06:12 PM   Modules accepted: Orders

## 2020-04-15 NOTE — Telephone Encounter (Signed)
rx done but sent to the selected pharmacy [facility]. Please call and cancel. I have resent to [facility].   Patient needs ov before the end of the year.

## 2020-04-16 NOTE — Telephone Encounter (Signed)
Called [facility] and cancelled Pantoprazole RX. [facility] waiting on a return call. Pt will need an apt by the end of the year for refills per [facility].

## 2020-04-23 ENCOUNTER — Encounter: Payer: Self-pay | Admitting: Internal Medicine

## 2020-05-20 DIAGNOSIS — Z0189 Encounter for other specified special examinations: Secondary | ICD-10-CM | POA: Diagnosis not present

## 2020-05-20 DIAGNOSIS — F5101 Primary insomnia: Secondary | ICD-10-CM | POA: Diagnosis not present

## 2020-05-20 DIAGNOSIS — K2100 Gastro-esophageal reflux disease with esophagitis, without bleeding: Secondary | ICD-10-CM | POA: Diagnosis not present

## 2020-05-25 ENCOUNTER — Ambulatory Visit: Payer: Self-pay

## 2020-08-18 ENCOUNTER — Encounter (HOSPITAL_COMMUNITY): Payer: Self-pay

## 2020-08-18 ENCOUNTER — Other Ambulatory Visit: Payer: Self-pay

## 2020-08-18 ENCOUNTER — Emergency Department (HOSPITAL_COMMUNITY)
Admission: EM | Admit: 2020-08-18 | Discharge: 2020-08-18 | Disposition: A | Discharge status: Home / Self Care | Payer: BC Managed Care – PPO | Attending: Emergency Medicine | Admitting: Emergency Medicine

## 2020-08-18 DIAGNOSIS — H6690 Otitis media, unspecified, unspecified ear: Secondary | ICD-10-CM

## 2020-08-18 DIAGNOSIS — H6692 Otitis media, unspecified, left ear: Secondary | ICD-10-CM | POA: Diagnosis not present

## 2020-08-18 DIAGNOSIS — F17200 Nicotine dependence, unspecified, uncomplicated: Secondary | ICD-10-CM | POA: Diagnosis not present

## 2020-08-18 DIAGNOSIS — H9202 Otalgia, left ear: Secondary | ICD-10-CM | POA: Diagnosis not present

## 2020-08-18 MED ORDER — AMOXICILLIN 500 MG PO CAPS: 500.0000 mg | ORAL_CAPSULE | Freq: Three times a day (TID) | ORAL | 0 refills | Status: DC

## 2020-08-18 MED ORDER — AMOXICILLIN 250 MG PO CAPS
500.0000 mg | ORAL_CAPSULE | Freq: Once | ORAL | Status: AC
  Administered 2020-08-18: 500 mg via ORAL
  Filled 2020-08-18: qty 2

## 2020-08-18 MED ORDER — ACETAMINOPHEN 500 MG PO TABS
1000.0000 mg | ORAL_TABLET | Freq: Once | ORAL | Status: AC
  Administered 2020-08-18: 1000 mg via ORAL
  Filled 2020-08-18: qty 2

## 2020-08-18 MED ORDER — IBUPROFEN 400 MG PO TABS
600.0000 mg | ORAL_TABLET | Freq: Once | ORAL | Status: AC
  Administered 2020-08-18: 600 mg via ORAL
  Filled 2020-08-18: qty 2

## 2020-08-18 NOTE — ED Triage Notes (Signed)
Pt reports left ear being stopped up and having pain that just started tonight [place]. No fever or any other symptoms reported.

## 2020-08-18 NOTE — ED Provider Notes (Signed)
[facility] EMERGENCY DEPARTMENT Provider Note   CSN: [id number] Arrival date & time: 08/18/20  0117   Time seen 5:14 AM  History Chief Complaint  Patient presents with  . Otalgia    Isaac Hubbard is a 39 y.o. male.  HPI   Patient [place] [facility] and he had no problems.  He states he drove home and did not have any problems however he woke up around 1130 or 12 midnight and he was having pain in his left ear with decreased hearing.  He states his jaw hurts.  He tried he did see salt in a sock that the pediatrician recommended for his daughter when she had a earaches.  He also put peroxide in it without relief.  He denies any fever.  He denies rhinorrhea but during his exam he was sniffling several times like he had a runny nose.  He has never had problems with his ear before.  PCP Benita Stabile, MD   Past Medical History:  Diagnosis Date  . Hyperlipidemia   . Prediabetes 08/2015   lifestyle/ dietary change    Patient Active Problem List   Diagnosis Date Noted  . Prediabetes 08/31/2015  . Hyperlipidemia LDL goal <100 08/31/2015  . Nicotine dependence 08/31/2015  . Herniated disc, cervical 02/26/2014  . Rotator cuff syndrome of right shoulder 02/17/2013  . Routine general medical examination at a health care facility 11/20/2012  . Elevated liver enzymes 10/14/2011  . Allergic rhinitis 09/21/2011  . Overweight 09/20/2008    Past Surgical History:  Procedure Laterality Date  . APPENDECTOMY    . BIOPSY  08/23/2018   Procedure: BIOPSY;  Surgeon: Corbin Ade, MD;  Location: [facility];  Service: Endoscopy;;  (gastric)  . ESOPHAGOGASTRODUODENOSCOPY N/A 08/23/2018   Procedure: ESOPHAGOGASTRODUODENOSCOPY (EGD);  Surgeon: Corbin Ade, MD;  Location: [facility];  Service: Endoscopy;  Laterality: N/A;  . SPINE SURGERY  2015   c spine  . TONSILLECTOMY         Family History  Problem Relation Age of Onset  . Diabetes  Father   . Hypertension Father   . Arthritis Father   . Hypertension Paternal Grandmother     Social History   Tobacco Use  . Smoking status: Current Every Day Smoker    Packs/day: 0.50  . Smokeless tobacco: Never Used  Vaping Use  . Vaping Use: Never used  Substance Use Topics  . Alcohol use: No  . Drug use: No    Home Medications Prior to Admission medications   Medication Sig Start Date End Date Taking? Authorizing Provider  amoxicillin (AMOXIL) 500 MG capsule Take 1 capsule (500 mg total) by mouth 3 (three) times daily. 08/18/20   Devoria Albe, MD  ibuprofen (ADVIL,MOTRIN) 800 MG tablet Take 1 tablet (800 mg total) by mouth every 8 (eight) hours as needed. 10/29/16   Vickki Hearing, MD  pantoprazole (PROTONIX) 40 MG tablet Take 1 tablet (40 mg total) by mouth daily before breakfast. 04/15/20   Tiffany Kocher, PA-C    Allergies    Patient has no known allergies.  Review of Systems   Review of Systems  All other systems reviewed and are negative.   Physical Exam Updated Vital Signs BP (!) 141/99 (BP Location: Right Arm)   Pulse 80   Temp 98.3 F (36.8 C) (Oral)   Resp 18   Ht 5\' 11"  (1.803 m)   Wt 65.8  kg   SpO2 98%   BMI 20.22 kg/m   Physical Exam Vitals and nursing note reviewed.  Constitutional:      Appearance: Normal appearance. He is normal weight.  HENT:     Head: Normocephalic and atraumatic.     Right Ear: Tympanic membrane, ear canal and external ear normal.     Left Ear: Ear canal and external ear normal.     Ears:     Comments: There is intense erythema of the anterior superior portion of the tympanic membrane on the left.  There was slight bulging but the lower portion of the tympanic membrane is normal. Eyes:     Extraocular Movements: Extraocular movements intact.     Conjunctiva/sclera: Conjunctivae normal.     Pupils: Pupils are equal, round, and reactive to light.  Cardiovascular:     Rate and Rhythm: Normal rate.  Pulmonary:      Effort: Pulmonary effort is normal. No respiratory distress.  Musculoskeletal:        General: Normal range of motion.     Cervical back: Normal range of motion.  Skin:    General: Skin is warm and dry.  Neurological:     General: No focal deficit present.     Mental Status: He is alert and oriented to person, place, and time.     Cranial Nerves: No cranial nerve deficit.  Psychiatric:        Mood and Affect: Mood normal.        Behavior: Behavior normal.        Thought Content: Thought content normal.     ED Results / Procedures / Treatments   Labs (all labs ordered are listed, but only abnormal results are displayed) Labs Reviewed - No data to display  EKG None  Radiology No results found.  Procedures Procedures (including critical care time)  Medications Ordered in ED Medications  ibuprofen (ADVIL) tablet 600 mg (has no administration in time range)  acetaminophen (TYLENOL) tablet 1,000 mg (has no administration in time range)  amoxicillin (AMOXIL) capsule 500 mg (has no administration in time range)    ED Course  I have reviewed the triage vital signs and the nursing notes.  Pertinent labs & imaging results that were available during my care of the patient were reviewed by me and considered in my medical decision making (see chart for details).    MDM Rules/Calculators/A&P                         We discussed using nasal spray to keep the eustachian tube opening clear so the fluid can drain from his ear, unfortunately Auralgan is not available anymore.  He can use any over-the-counter eardrops for pain that he can find.  He can take acetaminophen and/or ibuprofen for pain.  He was started on antibiotics.  He should return if he gets fever, drainage from the ear or the pain gets worse.   Final Clinical Impression(s) / ED Diagnoses Final diagnoses:  Acute otitis media, unspecified otitis media type    Rx / DC Orders ED Discharge Orders         Ordered     amoxicillin (AMOXIL) 500 MG capsule  3 times daily        08/18/20 0537        OTC ibuprofen and acetaminophen  Plan discharge  Devoria Albe, MD, Concha Pyo, MD 08/18/20 [phone]

## 2020-08-18 NOTE — Discharge Instructions (Addendum)
Take ibuprofen 600 mg plus acetaminophen 1000 mg every 6 hours as needed for pain.  Take the antibiotic until gone.  Please get Nasacort nasal spray over-the-counter and put 2 sprays in each nostril daily.  You can use [place] earache drops or Similasan earache drops for pain.  Recheck if you have drainage from the ear canal, your pain gets worse or you get a fever.

## 2020-11-18 DIAGNOSIS — Z3009 Encounter for other general counseling and advice on contraception: Secondary | ICD-10-CM | POA: Diagnosis not present

## 2020-11-20 DIAGNOSIS — Z302 Encounter for sterilization: Secondary | ICD-10-CM | POA: Diagnosis not present

## 2021-02-03 DIAGNOSIS — E1165 Type 2 diabetes mellitus with hyperglycemia: Secondary | ICD-10-CM | POA: Diagnosis not present

## 2021-02-06 DIAGNOSIS — E782 Mixed hyperlipidemia: Secondary | ICD-10-CM | POA: Diagnosis not present

## 2021-02-06 DIAGNOSIS — R7303 Prediabetes: Secondary | ICD-10-CM | POA: Diagnosis not present

## 2021-02-06 DIAGNOSIS — M542 Cervicalgia: Secondary | ICD-10-CM | POA: Diagnosis not present

## 2021-02-06 DIAGNOSIS — G4700 Insomnia, unspecified: Secondary | ICD-10-CM | POA: Diagnosis not present

## 2021-02-06 DIAGNOSIS — Z0001 Encounter for general adult medical examination with abnormal findings: Secondary | ICD-10-CM | POA: Diagnosis not present

## 2021-05-18 ENCOUNTER — Other Ambulatory Visit: Payer: Self-pay

## 2021-05-18 ENCOUNTER — Ambulatory Visit
Admission: RE | Admit: 2021-05-18 | Discharge: 2021-05-18 | Disposition: A | Discharge status: Home / Self Care | Payer: BC Managed Care – PPO | Source: Ambulatory Visit

## 2021-05-18 VITALS — BP 117/80 | HR 75 | Temp 98.9°F | Resp 16

## 2021-05-18 DIAGNOSIS — Z20822 Contact with and (suspected) exposure to covid-19: Secondary | ICD-10-CM | POA: Diagnosis not present

## 2021-05-18 DIAGNOSIS — B349 Viral infection, unspecified: Secondary | ICD-10-CM

## 2021-05-18 NOTE — ED Provider Notes (Signed)
[facility]    CSN: [id number] Arrival date & time: 05/18/21  1649      History   Chief Complaint No chief complaint on file.   HPI Isaac Hubbard is a 40 y.o. male.   HPI Patient presents with viral symptoms x 3 days of body aches, fever >(100) chills, headache. No known exposure to COVID or flu. Denies any associated URI symptom beside chronic allergies. Home COVID test negative x 1 day ago. Denies worrisome symptoms of shortness of breath, weakness, N&V, or chest pain. Past Medical History:  Diagnosis Date   Hyperlipidemia    Prediabetes 08/2015   lifestyle/ dietary change    Patient Active Problem List   Diagnosis Date Noted   Prediabetes 08/31/2015   Hyperlipidemia LDL goal <100 08/31/2015   Nicotine dependence 08/31/2015   Herniated disc, cervical 02/26/2014   Rotator cuff syndrome of right shoulder 02/17/2013   Routine general medical examination at a health care facility 11/20/2012   Elevated liver enzymes 10/14/2011   Allergic rhinitis 09/21/2011   Overweight 09/20/2008    Past Surgical History:  Procedure Laterality Date   APPENDECTOMY     BIOPSY  08/23/2018   Procedure: BIOPSY;  Surgeon: Corbin Ade, MD;  Location: [facility];  Service: Endoscopy;;  (gastric)   ESOPHAGOGASTRODUODENOSCOPY N/A 08/23/2018   Procedure: ESOPHAGOGASTRODUODENOSCOPY (EGD);  Surgeon: Corbin Ade, MD;  Location: [facility];  Service: Endoscopy;  Laterality: N/A;   SPINE SURGERY  2015   c spine   TONSILLECTOMY         Home Medications    Prior to Admission medications   Medication Sig Start Date End Date Taking? Authorizing Provider  pseudoephedrine (SUDAFED) 30 MG tablet Take 30 mg by mouth every 4 (four) hours as needed for congestion.   Yes [provider]  amoxicillin (AMOXIL) 500 MG capsule Take 1 capsule (500 mg total) by mouth 3 (three) times daily. 08/18/20   Devoria Albe, MD  ibuprofen (ADVIL,MOTRIN) 800 MG tablet Take 1 tablet (800 mg  total) by mouth every 8 (eight) hours as needed. 10/29/16   Vickki Hearing, MD  pantoprazole (PROTONIX) 40 MG tablet Take 1 tablet (40 mg total) by mouth daily before breakfast. 04/15/20   Tiffany Kocher, PA-C    Family History Family History  Problem Relation Age of Onset   Diabetes Father    Hypertension Father    Arthritis Father    Hypertension Paternal Grandmother     Social History Social History   Tobacco Use   Smoking status: Every Day    Packs/day: 0.50    Types: Cigarettes   Smokeless tobacco: Never  Vaping Use   Vaping Use: Never used  Substance Use Topics   Alcohol use: No   Drug use: No     Allergies   Patient has no known allergies.   Review of Systems Review of Systems Pertinent negatives listed in HPI   Physical Exam Triage Vital Signs ED Triage Vitals  Enc Vitals Group     BP 05/18/21 1657 117/80     Pulse Rate 05/18/21 1657 75     Resp 05/18/21 1657 16     Temp 05/18/21 1657 98.9 F (37.2 C)     Temp src --      SpO2 05/18/21 1657 96 %     Weight --      Height --      Head Circumference --      Peak  Flow --      Pain Score 05/18/21 1659 0     Pain Loc --      Pain Edu? --      Excl. in GC? --    No data found.  Updated Vital Signs BP 117/80 (BP Location: Right Arm)   Pulse 75   Temp 98.9 F (37.2 C)   Resp 16   SpO2 96%   Visual Acuity Right Eye Distance:   Left Eye Distance:   Bilateral Distance:    Right Eye Near:   Left Eye Near:    Bilateral Near:     Physical Exam Constitutional: Patient appears well-developed and well-nourished. No distress. HENT: Normocephalic, atraumatic, External right and left ear normal. Oropharynx patent  Eyes: Conjunctivae and EOM are normal. PERRLA, no scleral icterus. Neck: Normal ROM. Neck supple. No JVD. No tracheal deviation. No thyromegaly. CVS: RRR, S1/S2 +, no murmurs, no gallops, no carotid bruit.  Pulmonary: Effort and breath sounds normal, no stridor, rhonchi, wheezes,  rales.  Musculoskeletal: Normal range of motion. No edema and no tenderness.  Neuro: Alert. Normal reflexes, muscle tone coordination. No cranial nerve deficit. Skin: Skin is warm and dry. No rash noted. Not diaphoretic. No erythema. No pallor. Psychiatric: Normal mood and affect. Behavior, judgment, thought content normal.    UC Treatments / Results  Labs (all labs ordered are listed, but only abnormal results are displayed) Labs Reviewed  COVID-19, FLU A+B NAA    EKG   Radiology No results found.  Procedures Procedures (including critical care time)  Medications Ordered in UC Medications - No data to display  Initial Impression / Assessment and Plan / UC Course  I have reviewed the triage vital signs and the nursing notes.  Pertinent labs & imaging results that were available during my care of the patient were reviewed by me and considered in my medical decision making (see chart for details).    Viral illness, COVID/Flu test pending. Symptom management warranted only.  Manage fever with Tylenol and ibuprofen.  Nasal symptoms with over-the-counter antihistamines recommended.  Treatment per discharge medications/discharge instructions.  Red flags/ER precautions given. The most current CDC isolation/quarantine recommendation advised.    Final Clinical Impressions(s) / UC Diagnoses   Final diagnoses:  Exposure to COVID-19 virus  Viral illness     ED Prescriptions   None    PDMP not reviewed this encounter.   Bing Neighbors, FNP 05/18/21 [phone]

## 2021-05-18 NOTE — Discharge Instructions (Addendum)
COVID/Flu test pending. Symptom management warranted only.  Manage fever with Tylenol and ibuprofen.  Nasal symptoms with over-the-counter antihistamines recommended.  Treatment per discharge medications/discharge instructions.  Red flags/ER precautions given. The most current CDC isolation/quarantine recommendation advised.  Aleve and Ibuprofen.

## 2021-05-18 NOTE — ED Triage Notes (Signed)
Body aches, fatigue since Saturday.  Chills started on Sunday.  Scratchy throat on Saturday.  At home covid test last night was negative.

## 2021-05-20 LAB — COVID-19, FLU A+B NAA
Influenza A, NAA: NOT DETECTED
Influenza B, NAA: NOT DETECTED
SARS-CoV-2, NAA: DETECTED — AB

## 2021-06-22 ENCOUNTER — Encounter: Payer: Self-pay | Admitting: Gastroenterology

## 2021-06-22 ENCOUNTER — Other Ambulatory Visit: Payer: Self-pay | Admitting: Gastroenterology

## 2021-06-22 NOTE — Telephone Encounter (Signed)
Spoke to pt. Informed him of recommendations. Pt voiced understanding. 

## 2021-06-22 NOTE — Telephone Encounter (Signed)
Unable to refill pantoprazole as he has not been seen since his EGD in 2019. He needs an OV for additional refills. Please let patient know and arrange an OV for him. If he is out of his medication, recommend he reach out to his PCP for additional refills.

## 2021-06-22 NOTE — Telephone Encounter (Signed)
Noted  

## 2021-08-14 DIAGNOSIS — R7303 Prediabetes: Secondary | ICD-10-CM | POA: Diagnosis not present

## 2021-12-21 ENCOUNTER — Ambulatory Visit
Admission: EM | Admit: 2021-12-21 | Discharge: 2021-12-21 | Disposition: A | Discharge status: Home / Self Care | Payer: BC Managed Care – PPO | Attending: Urgent Care | Admitting: Urgent Care

## 2021-12-21 DIAGNOSIS — J309 Allergic rhinitis, unspecified: Secondary | ICD-10-CM | POA: Diagnosis not present

## 2021-12-21 DIAGNOSIS — J069 Acute upper respiratory infection, unspecified: Secondary | ICD-10-CM | POA: Diagnosis not present

## 2021-12-21 DIAGNOSIS — H6981 Other specified disorders of Eustachian tube, right ear: Secondary | ICD-10-CM

## 2021-12-21 DIAGNOSIS — R052 Subacute cough: Secondary | ICD-10-CM

## 2021-12-21 MED ORDER — PREDNISONE 50 MG PO TABS: 50.0000 mg | ORAL_TABLET | Freq: Every day | ORAL | 0 refills | Status: DC

## 2021-12-21 MED ORDER — AMOXICILLIN-POT CLAVULANATE 875-125 MG PO TABS: 1.0000 | ORAL_TABLET | Freq: Two times a day (BID) | ORAL | 0 refills | Status: DC

## 2021-12-21 NOTE — ED Provider Notes (Signed)
?[facility] ? ? ?MRN: [id number] DOB: [birth year] ? ?Subjective:  ? ?Isaac Hubbard is a 41 y.o. male presenting for 2 day history of acute onset right ear fullness, fluid filled sensation and discomfort.  No fever, ear drainage, tinnitus, dizziness.  Patient has a history of allergic rhinitis and takes levocetirizine and pseudoephedrine daily.  He has been doing a lot of travel and has gotten exposure to a lot of the grandmothers and pollen.  Has also had a cough but is not as bad.  No chest pain, shortness of breath or wheezing.  Patient quit smoking 2 years ago. ? ?No current facility-administered medications for this encounter. ? ?Current Outpatient Medications:  ?  amoxicillin (AMOXIL) 500 MG capsule, Take 1 capsule (500 mg total) by mouth 3 (three) times daily., Disp: 30 capsule, Rfl: 0 ?  ibuprofen (ADVIL,MOTRIN) 800 MG tablet, Take 1 tablet (800 mg total) by mouth every 8 (eight) hours as needed., Disp: 90 tablet, Rfl: 1 ?  pantoprazole (PROTONIX) 40 MG tablet, Take 1 tablet (40 mg total) by mouth daily before breakfast., Disp: 90 tablet, Rfl: 3 ?  pseudoephedrine (SUDAFED) 30 MG tablet, Take 30 mg by mouth every 4 (four) hours as needed for congestion., Disp: , Rfl:   ? ?No Known Allergies ? ?Past Medical History:  ?Diagnosis Date  ? Hyperlipidemia   ? Prediabetes 08/2015  ? lifestyle/ dietary change  ?  ? ?Past Surgical History:  ?Procedure Laterality Date  ? APPENDECTOMY    ? BIOPSY  08/23/2018  ? Procedure: BIOPSY;  Surgeon: Corbin Ade, MD;  Location: [facility];  Service: Endoscopy;;  (gastric)  ? ESOPHAGOGASTRODUODENOSCOPY N/A 08/23/2018  ? Procedure: ESOPHAGOGASTRODUODENOSCOPY (EGD);  Surgeon: Corbin Ade, MD;  [facility];  Service: Endoscopy;  Laterality: N/A;  ? SPINE SURGERY  2015  ? c spine  ? TONSILLECTOMY    ? ? ?Family History  ?Problem Relation Age of Onset  ? Diabetes Father   ? Hypertension Father   ? Arthritis Father   ? Hypertension Paternal Grandmother    ? ? ?Social History  ? ?Tobacco Use  ? Smoking status: Every Day  ?  Packs/day: 0.50  ?  Types: Cigarettes  ? Smokeless tobacco: Never  ?Vaping Use  ? Vaping Use: Never used  ?Substance Use Topics  ? Alcohol use: No  ? Drug use: No  ? ? ?ROS ? ? ?Objective:  ? ?Vitals: ?BP (!) 119/59   Pulse 78   Temp 98.6 ?F (37 ?C)   Resp 18   SpO2 94%  ? ?Physical Exam ?Constitutional:   ?   General: He is not in acute distress. ?   Appearance: Normal appearance. He is well-developed and normal weight. He is not ill-appearing, toxic-appearing or diaphoretic.  ?HENT:  ?   Head: Normocephalic and atraumatic.  ?   Right Ear: Ear canal and external ear normal. There is no impacted cerumen. Tympanic membrane is injected. Tympanic membrane is not erythematous or bulging.  ?   Left Ear: Tympanic membrane, ear canal and external ear normal. There is no impacted cerumen. Tympanic membrane is not injected, erythematous or bulging.  ?   Nose: Nose normal. No congestion or rhinorrhea.  ?   Mouth/Throat:  ?   Mouth: Mucous membranes are moist.  ?   Pharynx: No oropharyngeal exudate or posterior oropharyngeal erythema.  ?Eyes:  ?   General: No scleral icterus.    ?   Right eye: No discharge.     ?  Left eye: No discharge.  ?   Extraocular Movements: Extraocular movements intact.  ?   Conjunctiva/sclera: Conjunctivae normal.  ?Cardiovascular:  ?   Rate and Rhythm: Normal rate and regular rhythm.  ?   Heart sounds: Normal heart sounds. No murmur heard. ?  No friction rub. No gallop.  ?Pulmonary:  ?   Effort: Pulmonary effort is normal. No respiratory distress.  ?   Breath sounds: Normal breath sounds. No stridor. No wheezing, rhonchi or rales.  ?Musculoskeletal:  ?   Cervical back: Normal range of motion and neck supple. No rigidity. No muscular tenderness.  ?Neurological:  ?   General: No focal deficit present.  ?   Mental Status: He is alert and oriented to person, place, and time.  ?Psychiatric:     ?   Mood and Affect: Mood normal.      ?   Behavior: Behavior normal.     ?   Thought Content: Thought content normal.  ? ? ?Assessment and Plan :  ? ?PDMP not reviewed this encounter. ? ?1. Viral upper respiratory infection   ?2. Dysfunction of right eustachian tube   ?3. Subacute cough   ?4. Allergic rhinitis, unspecified seasonality, unspecified trigger   ? ?Unremarkable ENT exam.  Will use conservative management for what I suspect is eustachian tube dysfunction.  Recommended starting a prednisone course for difficult to control allergic rhinitis. Maintain regular allergy medications.  As the right TM was slightly injected I did offer the patient an prescription for Augmentin in the event that he is no improvement over the next week.  He is to discard this otherwise.  Patient is very agreeable.  Counseled patient on potential for adverse effects with medications prescribed/recommended today, ER and return-to-clinic precautions discussed, patient verbalized understanding. ? ?  ?Isaac Bamberg, PA-C ?12/21/21 1634 ? ?

## 2021-12-21 NOTE — ED Triage Notes (Signed)
Pt presents with right ear discomfort for past couple of days, more pressure and fullness than pain , had cough earlier  ?

## 2022-01-01 ENCOUNTER — Emergency Department (HOSPITAL_COMMUNITY)
Admission: EM | Admit: 2022-01-01 | Discharge: 2022-01-02 | Disposition: A | Discharge status: Home / Self Care | Payer: BC Managed Care – PPO | Attending: Emergency Medicine | Admitting: Emergency Medicine

## 2022-01-01 ENCOUNTER — Encounter (HOSPITAL_COMMUNITY): Payer: Self-pay

## 2022-01-01 ENCOUNTER — Other Ambulatory Visit: Payer: Self-pay

## 2022-01-01 DIAGNOSIS — R7309 Other abnormal glucose: Secondary | ICD-10-CM | POA: Insufficient documentation

## 2022-01-01 DIAGNOSIS — E871 Hypo-osmolality and hyponatremia: Secondary | ICD-10-CM

## 2022-01-01 DIAGNOSIS — R519 Headache, unspecified: Secondary | ICD-10-CM | POA: Diagnosis not present

## 2022-01-01 DIAGNOSIS — D72829 Elevated white blood cell count, unspecified: Secondary | ICD-10-CM | POA: Insufficient documentation

## 2022-01-01 DIAGNOSIS — R030 Elevated blood-pressure reading, without diagnosis of hypertension: Secondary | ICD-10-CM | POA: Diagnosis not present

## 2022-01-01 DIAGNOSIS — R739 Hyperglycemia, unspecified: Secondary | ICD-10-CM

## 2022-01-01 LAB — CBC WITH DIFFERENTIAL/PLATELET
Abs Immature Granulocytes: 0.02 10*3/uL (ref 0.00–0.07)
Basophils Absolute: 0 10*3/uL (ref 0.0–0.1)
Basophils Relative: 0 %
Eosinophils Absolute: 0 10*3/uL (ref 0.0–0.5)
Eosinophils Relative: 0 %
HCT: 41.9 % (ref 39.0–52.0)
Hemoglobin: 15.1 g/dL (ref 13.0–17.0)
Immature Granulocytes: 0 %
Lymphocytes Relative: 10 %
Lymphs Abs: 1.2 10*3/uL (ref 0.7–4.0)
MCH: 32.9 pg (ref 26.0–34.0)
MCHC: 36 g/dL (ref 30.0–36.0)
MCV: 91.3 fL (ref 80.0–100.0)
Monocytes Absolute: 0.5 10*3/uL (ref 0.1–1.0)
Monocytes Relative: 4 %
Neutro Abs: 10.3 10*3/uL — ABNORMAL HIGH (ref 1.7–7.7)
Neutrophils Relative %: 86 %
Platelets: 234 10*3/uL (ref 150–400)
RBC: 4.59 MIL/uL (ref 4.22–5.81)
RDW: 12.9 % (ref 11.5–15.5)
WBC: 12.1 10*3/uL — ABNORMAL HIGH (ref 4.0–10.5)
nRBC: 0 % (ref 0.0–0.2)

## 2022-01-01 LAB — BASIC METABOLIC PANEL
Anion gap: 7 (ref 5–15)
BUN: 15 mg/dL (ref 6–20)
CO2: 26 mmol/L (ref 22–32)
Calcium: 9.8 mg/dL (ref 8.9–10.3)
Chloride: 101 mmol/L (ref 98–111)
Creatinine, Ser: 1.14 mg/dL (ref 0.61–1.24)
GFR, Estimated: >60 mL/min (ref >60)
Glucose, Bld: 220 mg/dL — ABNORMAL HIGH (ref 70–99)
Potassium: 4 mmol/L (ref 3.5–5.1)
Sodium: 134 mmol/L — ABNORMAL LOW (ref 135–145)

## 2022-01-01 NOTE — ED Triage Notes (Signed)
Pt arrived via POV c/o severe headache since yesterday. Pt reports minimal relief from OTC Tylenol. Pt denies N/V but endorses mild dizziness. Pt reports pain is over left eye.  ?

## 2022-01-02 MED ORDER — PROCHLORPERAZINE EDISYLATE 10 MG/2ML IJ SOLN
10.0000 mg | Freq: Once | INTRAMUSCULAR | Status: AC
  Administered 2022-01-02: 10 mg via INTRAVENOUS
  Filled 2022-01-02: qty 2

## 2022-01-02 MED ORDER — DEXAMETHASONE SODIUM PHOSPHATE 10 MG/ML IJ SOLN
10.0000 mg | Freq: Once | INTRAMUSCULAR | Status: AC
  Administered 2022-01-02: 10 mg via INTRAVENOUS
  Filled 2022-01-02: qty 1

## 2022-01-02 MED ORDER — LACTATED RINGERS IV BOLUS
1000.0000 mL | Freq: Once | INTRAVENOUS | Status: AC
  Administered 2022-01-02: 1000 mL via INTRAVENOUS

## 2022-01-02 NOTE — Discharge Instructions (Addendum)
Return if you are having any problems. ? ?Your blood sugar was a little high today.  Please follow-up with your primary care provider to monitor it.  It is probably related to your recent course of prednisone, and may come down once you no longer have steroids in your system, but it may also be that you are developing diabetes. ?

## 2022-01-02 NOTE — ED Provider Notes (Signed)
?[facility] ?Provider Note ? ? ?CSN: [id number] ?Arrival date & time: 01/01/22  2114 ? ?  ? ?History ? ?Chief Complaint  ?Patient presents with  ? Migraine  ? ? ?Isaac Hubbard is a 41 y.o. male. ? ?The history is provided by the patient.  ?Migraine ?He has history of prediabetes, hyperlipidemia and comes in because of a left-sided headache.  Headache started yesterday and was just a mild, dull, generalized headache which resolved after a dose of acetaminophen.  Headache recurred when he woke up this morning, and got progressively worse through the day.  It is now mainly left-sided and pounding with associated blurred vision and nausea.  He has noted photophobia and phonophobia.  He has not vomited.  There has been no numbness or weakness.  He has taken acetaminophen which seems to give slight relief.  He denies having had similar headaches in the past.  He denies fever or chills.  He states that since being placed in his room in the ED, headache is subsiding but is still present. ?  ?Home Medications ?Prior to Admission medications   ?Medication Sig Start Date End Date Taking? Authorizing Provider  ?acetaminophen (TYLENOL) 325 MG tablet Take 1,000 mg by mouth every 6 (six) hours as needed for headache.   Yes [provider]  ?pantoprazole (PROTONIX) 40 MG tablet Take 1 tablet (40 mg total) by mouth daily before breakfast. 04/15/20  Yes Mahala Menghini, PA-C  ?predniSONE (DELTASONE) 50 MG tablet Take 1 tablet (50 mg total) by mouth daily with breakfast. 12/21/21  Yes Jaynee Eagles, PA-C  ?amoxicillin (AMOXIL) 500 MG capsule Take 1 capsule (500 mg total) by mouth 3 (three) times daily. 08/18/20   Rolland Porter, MD  ?amoxicillin-clavulanate (AUGMENTIN) 875-125 MG tablet Take 1 tablet by mouth 2 (two) times daily. 12/21/21   Jaynee Eagles, PA-C  ?ibuprofen (ADVIL,MOTRIN) 800 MG tablet Take 1 tablet (800 mg total) by mouth every 8 (eight) hours as needed. 10/29/16   Carole Civil, MD  ?pseudoephedrine  (SUDAFED) 30 MG tablet Take 30 mg by mouth every 4 (four) hours as needed for congestion.    [provider]  ?   ? ?Allergies    ?Patient has no known allergies.   ? ?Review of Systems   ?Review of Systems  ?All other systems reviewed and are negative. ? ?Physical Exam ?Updated Vital Signs ?BP (!) 148/98 (BP Location: Right Arm)   Pulse 91   Temp 98 ?F (36.7 ?C) (Oral)   Resp 18   Ht 5\' 11"  (1.803 m)   Wt 88.1 kg   SpO2 99%   BMI 27.08 kg/m?  ?Physical Exam ?Vitals and nursing note reviewed.  ?41 year old male, resting comfortably and in no acute distress. Vital signs are significant for elevated blood pressure. Oxygen saturation is 99%, which is normal. ?Head is normocephalic and atraumatic. PERRLA, EOMI. Oropharynx is clear.  Fundi show no hemorrhage or exudate.  There is no tenderness to palpation over the temporalis muscle or over the insertion of paracervical muscles. ?Neck is nontender and supple without adenopathy . ?Lungs are clear without rales, wheezes, or rhonchi. ?Chest is nontender. ?Heart has regular rate and rhythm without murmur. ?Abdomen is soft, flat, nontender. ?Extremities have no cyanosis or edema, full range of motion is present. ?Skin is warm and dry without rash. ?Neurologic: Mental status is normal, cranial nerves are intact, strength is 5/5 in all 4 extremities. ? ?ED Results / Procedures / Treatments   ?  Labs ?(all labs ordered are listed, but only abnormal results are displayed) ?Labs Reviewed  ?CBC WITH DIFFERENTIAL/PLATELET - Abnormal; Notable for the following components:  ?    Result Value  ? WBC 12.1 (*)   ? Neutro Abs 10.3 (*)   ? All other components within normal limits  ?BASIC METABOLIC PANEL - Abnormal; Notable for the following components:  ? Sodium 134 (*)   ? Glucose, Bld 220 (*)   ? All other components within normal limits  ? ? ?Procedures ?Procedures  ? ? ?Medications Ordered in ED ?Medications  ?lactated ringers bolus 1,000 mL (1,000 mLs Intravenous New  Bag/Given 01/02/22 0219)  ?prochlorperazine (COMPAZINE) injection 10 mg (10 mg Intravenous Given 01/02/22 0219)  ?dexamethasone (DECADRON) injection 10 mg (10 mg Intravenous Given 01/02/22 0219)  ? ? ?ED Course/ Medical Decision Making/ A&P ?  ?                        ?Medical Decision Making ?Amount and/or Complexity of Data Reviewed ?Labs: ordered. ? ?Risk ?Prescription drug management. ? ? ?Headache which, based on history and physical, it seems most likely to be either migraine variant or muscle contraction headache.  No red flags to suggest serious causes of headache such as meningitis, subarachnoid hemorrhage.  No indication for imaging at this point.  Old records are reviewed, and he has no relevant past visits.  He will be given a headache cocktail of lactated Ringer's solution, prochlorperazine, dexamethasone and reassessed. ? ?Labs showed mild leukocytosis which is nonspecific, borderline hyponatremia which is not felt to be clinically significant, and elevated glucose of 220.  It is noted that he just completed a course of prednisone, which make worsening underlying prediabetes.  However, this will need to be monitored as an outpatient and he will probably need to have A1c levels checked.  He had complete relief of his headache with above-noted treatment, and is discharged to follow-up with PCP. ? ?Final Clinical Impression(s) / ED Diagnoses ?Final diagnoses:  ?Bad headache  ?Elevated random blood glucose level  ?Hyponatremia  ? ? ?Rx / DC Orders ?ED Discharge Orders   ? ? None  ? ?  ? ? ?  ?Delora Fuel, MD ?[id number] [phone] ? ?

## 2022-01-11 ENCOUNTER — Ambulatory Visit: Payer: BC Managed Care – PPO | Admitting: Internal Medicine

## 2022-02-05 ENCOUNTER — Encounter: Payer: Self-pay | Admitting: Internal Medicine

## 2022-02-05 ENCOUNTER — Ambulatory Visit: Payer: BC Managed Care – PPO | Admitting: Internal Medicine

## 2022-02-05 VITALS — BP 114/74 | HR 71 | Ht 71.0 in | Wt 190.8 lb

## 2022-02-05 DIAGNOSIS — R7303 Prediabetes: Secondary | ICD-10-CM | POA: Diagnosis not present

## 2022-02-05 DIAGNOSIS — K219 Gastro-esophageal reflux disease without esophagitis: Secondary | ICD-10-CM | POA: Diagnosis not present

## 2022-02-05 DIAGNOSIS — E785 Hyperlipidemia, unspecified: Secondary | ICD-10-CM

## 2022-02-05 LAB — POCT GLYCOSYLATED HEMOGLOBIN (HGB A1C): HbA1c, POC (controlled diabetic range): 5.8 % (ref 0.0–7.0)

## 2022-02-05 MED ORDER — PANTOPRAZOLE SODIUM 40 MG PO TBEC: 40.0000 mg | DELAYED_RELEASE_TABLET | Freq: Every day | ORAL | 1 refills | Status: DC

## 2022-02-05 NOTE — Assessment & Plan Note (Signed)
Lab Results  Component Value Date   HGBA1C 5.8 02/05/2022   Advised to follow low-carb diet and perform moderate exercise

## 2022-02-05 NOTE — Progress Notes (Signed)
New Patient Office Visit  Subjective:  Patient ID: Isaac Hubbard, male    DOB: September 07, 1981  Age: 41 y.o. MRN: [id number]  CC:  Chief Complaint  Patient presents with   New Patient (Initial Visit)    Establish care.    HPI Isaac Hubbard is a 41 y.o. male with past medical history of GERD, prediabetes and HLD who presents for establishing care.  GERD: He takes pantoprazole for it.  He denies any nausea, vomiting, dysphagia or odynophagia.  Prediabetes: His HbA1c was 5.9 today.  His recent CMP showed blood glucose of 220 when he went to ER for headache.  He currently denies any polyuria or polyphagia.  He has history of HLD.  He has quit smoking.  Denies any dyspnea or wheezing currently.    Past Medical History:  Diagnosis Date   Hyperlipidemia    Nicotine dependence 08/31/2015   Prediabetes 08/2015   lifestyle/ dietary change    Past Surgical History:  Procedure Laterality Date   APPENDECTOMY     BIOPSY  08/23/2018   Procedure: BIOPSY;  Surgeon: Corbin Ade, MD;  Location: [facility];  Service: Endoscopy;;  (gastric)   ESOPHAGOGASTRODUODENOSCOPY N/A 08/23/2018   Procedure: ESOPHAGOGASTRODUODENOSCOPY (EGD);  Surgeon: Corbin Ade, MD;  Location: [facility];  Service: Endoscopy;  Laterality: N/A;   SPINE SURGERY  2015   c spine   TONSILLECTOMY      Family History  Problem Relation Age of Onset   Diabetes Father    Hypertension Father    Arthritis Father    Hypertension Paternal Grandmother     Social History   Socioeconomic History   Marital status: Single    Spouse name: Not on file   Number of children: Not on file   Years of education: Not on file   Highest education level: Not on file  Occupational History   Not on file  Tobacco Use   Smoking status: Former    Packs/day: 0.50    Types: Cigarettes    Quit date: 2020    Years since quitting: 3.4   Smokeless tobacco: Never  Vaping Use   Vaping Use: Never used  Substance and Sexual Activity    Alcohol use: No   Drug use: No   Sexual activity: Yes  Other Topics Concern   Not on file  Social History Narrative   Not on file   Social Determinants of Health   Financial Resource Strain: Not on file  Food Insecurity: Not on file  Transportation Needs: Not on file  Physical Activity: Not on file  Stress: Not on file  Social Connections: Not on file  Intimate Partner Violence: Not on file    ROS Review of Systems  Constitutional:  Negative for chills and fever.  HENT:  Negative for congestion and sore throat.   Eyes:  Negative for pain and discharge.  Respiratory:  Negative for cough and shortness of breath.   Cardiovascular:  Negative for chest pain and palpitations.  Gastrointestinal:  Negative for constipation, diarrhea, nausea and vomiting.  Endocrine: Negative for polydipsia and polyuria.  Genitourinary:  Negative for dysuria and hematuria.  Musculoskeletal:  Negative for neck pain and neck stiffness.  Skin:  Negative for rash.  Neurological:  Negative for dizziness, weakness, numbness and headaches.  Psychiatric/Behavioral:  Negative for agitation and behavioral problems.    Objective:   Today's Vitals: BP 114/74 (BP Location: Right Arm, Cuff Size: Normal)   Pulse 71  Ht 5\' 11"  (1.803 m)   Wt 190 lb 12.8 oz (86.5 kg)   SpO2 98%   BMI 26.61 kg/m   Physical Exam Vitals reviewed.  Constitutional:      General: He is not in acute distress.    Appearance: He is not diaphoretic.  HENT:     Head: Normocephalic and atraumatic.     Nose: Nose normal.     Mouth/Throat:     Mouth: Mucous membranes are moist.  Eyes:     General: No scleral icterus.    Extraocular Movements: Extraocular movements intact.  Cardiovascular:     Rate and Rhythm: Normal rate and regular rhythm.     Pulses: Normal pulses.     Heart sounds: Normal heart sounds. No murmur heard. Pulmonary:     Breath sounds: Normal breath sounds. No wheezing or rales.  Musculoskeletal:     Cervical  back: Neck supple. No tenderness.     Right lower leg: No edema.     Left lower leg: No edema.  Skin:    General: Skin is warm.     Findings: No rash.  Neurological:     General: No focal deficit present.     Mental Status: He is alert and oriented to person, place, and time.     Sensory: No sensory deficit.     Motor: No weakness.  Psychiatric:        Mood and Affect: Mood normal.        Behavior: Behavior normal.    Assessment & Plan:   Problem List Items Addressed This Visit       Digestive   Gastroesophageal reflux disease - Primary    Well controlled with pantoprazole, refilled       Relevant Medications   pantoprazole (PROTONIX) 40 MG tablet     Other   Prediabetes    Lab Results  Component Value Date   HGBA1C 5.8 02/05/2022  Advised to follow low-carb diet and perform moderate exercise      Relevant Orders   POCT glycosylated hemoglobin (Hb A1C) (Completed)   Hyperlipidemia LDL goal <100    Will try to obtain blood test reports from previous PCP Advised to follow low-cholesterol diet for now        Outpatient Encounter Medications as of 02/05/2022  Medication Sig   pseudoephedrine (SUDAFED) 30 MG tablet Take 30 mg by mouth every 4 (four) hours as needed for congestion.   [DISCONTINUED] pantoprazole (PROTONIX) 40 MG tablet Take 1 tablet (40 mg total) by mouth daily before breakfast.   acetaminophen (TYLENOL) 325 MG tablet Take 1,000 mg by mouth every 6 (six) hours as needed for headache. (Patient not taking: Reported on 02/05/2022)   pantoprazole (PROTONIX) 40 MG tablet Take 1 tablet (40 mg total) by mouth daily before breakfast.   [DISCONTINUED] ibuprofen (ADVIL,MOTRIN) 800 MG tablet Take 1 tablet (800 mg total) by mouth every 8 (eight) hours as needed. (Patient not taking: Reported on 02/05/2022)   No facility-administered encounter medications on file as of 02/05/2022.    Follow-up: Return in about 6 months (around 08/08/2022) for Annual physical.    08/10/2022, MD

## 2022-02-05 NOTE — Assessment & Plan Note (Addendum)
Will try to obtain blood test reports from previous PCP Advised to follow low-cholesterol diet for now

## 2022-02-05 NOTE — Assessment & Plan Note (Signed)
Well controlled with pantoprazole, refilled

## 2022-02-05 NOTE — Patient Instructions (Signed)
Please continue taking medications as prescribed.  Please avoid hot and spicy food.  Please continue to follow heart healthy diet and perform moderate exercise/walking at least 150 mins/week.

## 2022-07-23 ENCOUNTER — Other Ambulatory Visit: Payer: Self-pay | Admitting: Internal Medicine

## 2022-07-23 DIAGNOSIS — K219 Gastro-esophageal reflux disease without esophagitis: Secondary | ICD-10-CM

## 2022-08-13 ENCOUNTER — Encounter: Payer: BC Managed Care – PPO | Admitting: Internal Medicine

## 2022-10-08 ENCOUNTER — Ambulatory Visit
Admission: RE | Admit: 2022-10-08 | Discharge: 2022-10-08 | Disposition: A | Discharge status: Home / Self Care | Payer: BC Managed Care – PPO | Source: Ambulatory Visit | Attending: Internal Medicine | Admitting: Internal Medicine

## 2022-10-08 VITALS — BP 128/66 | HR 79 | Temp 98.2°F | Resp 16

## 2022-10-08 DIAGNOSIS — J069 Acute upper respiratory infection, unspecified: Secondary | ICD-10-CM | POA: Insufficient documentation

## 2022-10-08 DIAGNOSIS — U071 COVID-19: Secondary | ICD-10-CM | POA: Insufficient documentation

## 2022-10-08 NOTE — Discharge Instructions (Signed)
You have a viral illness that should run its course and self resolve with symptomatic treatment as we discussed.  All the medications that you have been taking are reasonable.  Recommend fluids and rest.  COVID test is pending.  We will call if it is positive.

## 2022-10-08 NOTE — ED Provider Notes (Signed)
[facility]    CSN: [id number] Arrival date & time: 10/08/22  1244      History   Chief Complaint Chief Complaint  Patient presents with   Chills    Head cold, cough, body aches, and fever. - Entered by patient   Fever   Headache   Generalized Body Aches    HPI Isaac Hubbard is a 42 y.o. male.   Patient presents with nasal congestion, fever, cough, generalized bodyaches, chills that started approximately 3 to 4 days ago.  Patient reports that he has taken Mucinex, Sudafed, NyQuil with improvement in symptoms.  Patient states that he is feeling much better but his girlfriend wanted him to be evaluated.  Tmax at home was 100.3.  Denies any known sick contacts.  Denies chest pain, shortness of breath, sore throat, ear pain, nausea, vomiting, diarrhea, abdominal pain.  Denies history of asthma or COPD.   Fever Headache   Past Medical History:  Diagnosis Date   Hyperlipidemia    Nicotine dependence 08/31/2015   Prediabetes 08/2015   lifestyle/ dietary change    Patient Active Problem List   Diagnosis Date Noted   Gastroesophageal reflux disease 02/05/2022   Prediabetes 08/31/2015   Hyperlipidemia LDL goal <100 08/31/2015   Herniated disc, cervical 02/26/2014   Rotator cuff syndrome of right shoulder 02/17/2013   Routine general medical examination at a health care facility 11/20/2012   Allergic rhinitis 09/21/2011   Overweight 09/20/2008    Past Surgical History:  Procedure Laterality Date   APPENDECTOMY     BIOPSY  08/23/2018   Procedure: BIOPSY;  Surgeon: Daneil Dolin, MD;  Location: [facility];  Service: Endoscopy;;  (gastric)   ESOPHAGOGASTRODUODENOSCOPY N/A 08/23/2018   Procedure: ESOPHAGOGASTRODUODENOSCOPY (EGD);  Surgeon: Daneil Dolin, MD;  Location: [facility];  Service: Endoscopy;  Laterality: N/A;   SPINE SURGERY  2015   c spine   TONSILLECTOMY         Home Medications    Prior to Admission medications   Medication Sig  Start Date End Date Taking? Authorizing Provider  acetaminophen (TYLENOL) 325 MG tablet Take 1,000 mg by mouth every 6 (six) hours as needed for headache. Patient not taking: Reported on 02/05/2022    [provider]  pantoprazole (PROTONIX) 40 MG tablet TAKE 1 TABLET BY MOUTH DAILY BEFORE BREAKFAST 07/23/22   Lindell Spar, MD  pseudoephedrine (SUDAFED) 30 MG tablet Take 30 mg by mouth every 4 (four) hours as needed for congestion.    [provider]    Family History Family History  Problem Relation Age of Onset   Diabetes Father    Hypertension Father    Arthritis Father    Hypertension Paternal Grandmother     Social History Social History   Tobacco Use   Smoking status: Former    Packs/day: 0.50    Types: Cigarettes    Quit date: 2020    Years since quitting: 4.0   Smokeless tobacco: Never  Vaping Use   Vaping Use: Never used  Substance Use Topics   Alcohol use: No   Drug use: No     Allergies   Patient has no known allergies.   Review of Systems Review of Systems Per HPI  Physical Exam Triage Vital Signs ED Triage Vitals  Enc Vitals Group     BP 10/08/22 1303 128/66     Pulse Rate 10/08/22 1303 79     Resp 10/08/22 1303 16  Temp 10/08/22 1303 98.2 F (36.8 C)     Temp Source 10/08/22 1303 Oral     SpO2 10/08/22 1303 96 %     Weight --      Height --      Head Circumference --      Peak Flow --      Pain Score 10/08/22 1302 0     Pain Loc --      Pain Edu? --      Excl. in GC? --    No data found.  Updated Vital Signs BP 128/66 (BP Location: Left Arm)   Pulse 79   Temp 98.2 F (36.8 C) (Oral)   Resp 16   SpO2 96%   Visual Acuity Right Eye Distance:   Left Eye Distance:   Bilateral Distance:    Right Eye Near:   Left Eye Near:    Bilateral Near:     Physical Exam Constitutional:      General: He is not in acute distress.    Appearance: Normal appearance. He is not toxic-appearing or diaphoretic.  HENT:      Head: Normocephalic and atraumatic.     Right Ear: Tympanic membrane and ear canal normal.     Left Ear: Tympanic membrane and ear canal normal.     Nose: Congestion present.     Mouth/Throat:     Mouth: Mucous membranes are moist.     Pharynx: No posterior oropharyngeal erythema.  Eyes:     Extraocular Movements: Extraocular movements intact.     Conjunctiva/sclera: Conjunctivae normal.     Pupils: Pupils are equal, round, and reactive to light.  Cardiovascular:     Rate and Rhythm: Normal rate and regular rhythm.     Pulses: Normal pulses.     Heart sounds: Normal heart sounds.  Pulmonary:     Effort: Pulmonary effort is normal. No respiratory distress.     Breath sounds: Normal breath sounds. No stridor. No wheezing, rhonchi or rales.  Abdominal:     General: Abdomen is flat. Bowel sounds are normal.     Palpations: Abdomen is soft.  Musculoskeletal:        General: Normal range of motion.     Cervical back: Normal range of motion.  Skin:    General: Skin is warm and dry.  Neurological:     General: No focal deficit present.     Mental Status: He is alert and oriented to person, place, and time. Mental status is at baseline.  Psychiatric:        Mood and Affect: Mood normal.        Behavior: Behavior normal.      UC Treatments / Results  Labs (all labs ordered are listed, but only abnormal results are displayed) Labs Reviewed  SARS CORONAVIRUS 2 (TAT 6-24 HRS)    EKG   Radiology No results found.  Procedures Procedures (including critical care time)  Medications Ordered in UC Medications - No data to display  Initial Impression / Assessment and Plan / UC Course  I have reviewed the triage vital signs and the nursing notes.  Pertinent labs & imaging results that were available during my care of the patient were reviewed by me and considered in my medical decision making (see chart for details).     Patient presents with symptoms likely from a viral  upper respiratory infection. Do not suspect underlying cardiopulmonary process. Symptoms seem unlikely related to ACS, CHF or COPD exacerbations, pneumonia,  pneumothorax. Patient is nontoxic appearing and not in need of emergent medical intervention.  COVID test pending.  Flu testing deferred given duration of symptoms and limited resources for flu testing here in urgent care.  Recommended symptom control with over the counter medications: Daily oral anti-histamine, Oral decongestant or IN corticosteroid, saline irrigations, cepacol lozenges, Robitussin, Delsym, honey tea.  Return if symptoms fail to improve in 1-2 weeks or you develop shortness of breath, chest pain, severe headache. Patient states understanding and is agreeable.  Discharged with PCP followup.  Final Clinical Impressions(s) / UC Diagnoses   Final diagnoses:  Viral upper respiratory tract infection with cough     Discharge Instructions      You have a viral illness that should run its course and self resolve with symptomatic treatment as we discussed.  All the medications that you have been taking are reasonable.  Recommend fluids and rest.  COVID test is pending.  We will call if it is positive.    ED Prescriptions   None    PDMP not reviewed this encounter.   Teodora Medici, [place] 10/08/22 1328

## 2022-10-08 NOTE — ED Triage Notes (Signed)
Patient presents to UC for fever, cough, body aches, and chills since Weds night. Treating symptoms with sudafed, allergy med, mucinex, dayquiil, and nyquil. Last known fever weds.

## 2022-10-09 ENCOUNTER — Ambulatory Visit: Payer: Self-pay

## 2022-10-09 LAB — SARS CORONAVIRUS 2 (TAT 6-24 HRS): SARS Coronavirus 2: POSITIVE — AB

## 2022-10-16 ENCOUNTER — Other Ambulatory Visit: Payer: Self-pay | Admitting: Internal Medicine

## 2022-10-16 DIAGNOSIS — K219 Gastro-esophageal reflux disease without esophagitis: Secondary | ICD-10-CM

## 2022-11-01 ENCOUNTER — Encounter: Payer: BC Managed Care – PPO | Admitting: Internal Medicine

## 2022-12-14 ENCOUNTER — Other Ambulatory Visit (INDEPENDENT_AMBULATORY_CARE_PROVIDER_SITE_OTHER): Payer: Self-pay

## 2022-12-14 ENCOUNTER — Ambulatory Visit (INDEPENDENT_AMBULATORY_CARE_PROVIDER_SITE_OTHER): Payer: Self-pay | Admitting: Orthopedic Surgery

## 2022-12-14 ENCOUNTER — Telehealth: Payer: Self-pay | Admitting: Orthopedic Surgery

## 2022-12-14 ENCOUNTER — Encounter: Payer: Self-pay | Admitting: Orthopedic Surgery

## 2022-12-14 ENCOUNTER — Telehealth: Payer: Self-pay | Admitting: Radiology

## 2022-12-14 VITALS — BP 124/90 | HR 74 | Ht 71.0 in | Wt 190.0 lb

## 2022-12-14 DIAGNOSIS — M25511 Pain in right shoulder: Secondary | ICD-10-CM

## 2022-12-14 DIAGNOSIS — M25512 Pain in left shoulder: Secondary | ICD-10-CM

## 2022-12-14 DIAGNOSIS — M542 Cervicalgia: Secondary | ICD-10-CM

## 2022-12-14 MED ORDER — PREDNISONE 10 MG (21) PO TBPK: ORAL_TABLET | ORAL | 0 refills | Status: DC

## 2022-12-14 NOTE — Telephone Encounter (Signed)
Leta,   I have added on this patient to see Dr. Amedeo Kinsman today at 2:30   Thanks,

## 2022-12-14 NOTE — Telephone Encounter (Signed)
Patient's mother had called the office to request an appointment with Dr Aline Brochure.  Staff had some difficulty in finding an open spot soon for him.  I called LMVM for Remo Lipps (mother) to call me back to discuss.

## 2022-12-14 NOTE — Patient Instructions (Addendum)
Note for work - light duty, no lifting anything heavier than 10 lbs

## 2022-12-15 NOTE — Progress Notes (Signed)
New Patient Visit  Assessment: Ralphael A Fudala is a 42 y.o. male with the following: 1. Right shoulder pain, unspecified chronicity  Plan: Dagmawi A Rembert has pain in his right shoulder.  Pain starts in the right side of his neck, and radiates distally.  He does have some tenderness over the anterior shoulder.  He also notes some numbness and tingling going all the way to his hand.  This affects him on a nightly basis.  No specific injury.  No dislocation.  He does have a history of cervical spine fusion secondary to an acute trauma, but has done well for many years following the surgery.  Characterization of his pain is most consistent with pressure on the nerves.  He does have some tenderness over the anterior shoulder, but the remainder of his symptoms once again are more consistent with a nerve irritation.  I recommended a short course of prednisone.  If his symptoms do not improve, we may have to consider having him see his spine surgeon in follow-up.  I will see him back in 2 weeks to assess his improvement.   Follow-up: Return in about 2 weeks (around 12/28/2022).  Subjective:  Chief Complaint  Patient presents with   Shoulder Pain    Right shoulder pain     History of Present Illness: Perle A Brenna is a 42 y.o. male who presents for evaluation of right shoulder pain.  He has been having pain throughout the right shoulder for the past 2 weeks.  No specific injury.  No prior injuries to his right shoulder.  He has slightly restricted range of motion.  Pain radiates from the right neck area, through his shoulder, and into his hand.  He has numbness and tingling.  He states his right arm will go completely numb, especially at nighttime.  The pain has been severe.  Has been affecting his sleep.  Of note, he has a history of cervical spine fusion, that was completed following an accident several years ago.  He had no issues following this surgery.  He was able to regain his motion and his function.  No recent  injuries.   Review of Systems: No fevers or chills + numbness and tingling No chest pain No shortness of breath No bowel or bladder dysfunction No GI distress No headaches   Medical History:  Past Medical History:  Diagnosis Date   Hyperlipidemia    Nicotine dependence 08/31/2015   Prediabetes 08/2015   lifestyle/ dietary change    Past Surgical History:  Procedure Laterality Date   APPENDECTOMY     BIOPSY  08/23/2018   Procedure: BIOPSY;  Surgeon: Daneil Dolin, MD;  Location: [facility];  Service: Endoscopy;;  (gastric)   ESOPHAGOGASTRODUODENOSCOPY N/A 08/23/2018   Procedure: ESOPHAGOGASTRODUODENOSCOPY (EGD);  Surgeon: Daneil Dolin, MD;  [facility]: [facility];  Service: Endoscopy;  Laterality: N/A;   SPINE SURGERY  2015   c spine   TONSILLECTOMY      Family History  Problem Relation Age of Onset   Diabetes Father    Hypertension Father    Arthritis Father    Hypertension Paternal Grandmother    Social History   Tobacco Use   Smoking status: Former    Packs/day: .5    Types: Cigarettes    Quit date: 2020    Years since quitting: 4.2   Smokeless tobacco: Never  Vaping Use   Vaping Use: Never used  Substance Use Topics   Alcohol use: No  Drug use: No    No Known Allergies  Current Meds  Medication Sig   predniSONE (STERAPRED UNI-PAK 21 TAB) 10 MG (21) TBPK tablet 10 mg DS 12 as directed    Objective: BP (!) 124/90   Pulse 74   Ht 5\' 11"  (1.803 m)   Wt 190 lb (86.2 kg)   BMI 26.50 kg/m   Physical Exam:  General: Alert and oriented. and No acute distress. Gait: Normal gait.  Well-developed male.  No atrophy in bilateral upper extremities.  He has good range of motion, with some obvious discomfort in the anterior aspect of the right shoulder.  No deformity.  No bruising.  No swelling.  Good upper body strength bilaterally.  Tenderness to palpation within the right trapezius, traversing the shoulder, including the anterior aspect of  the right shoulder.  Fingers are warm and well-perfused.  IMAGING: I personally ordered and reviewed the following images  X-rays of bilateral shoulders were obtained in clinic today.  No acute injuries are noted.  Glenohumeral joint is reduced.  No evidence of proximal humeral migration.  Minimal degenerative changes at the [facility] joint bilaterally.  No bony lesions.  Impression: Negative bilateral shoulder x-rays   X-ray of the cervical spine was obtained in clinic today.  No acute injuries noted.  Hardware remains intact.  No evidence of lucency around the hardware.  There is a solid fusion at C6-7.  No anterolisthesis.  Minimal degenerative changes.  Maintenance of normal curvature.  Impression: Cervical spine x-ray without acute injury or hardware failure, fusion at C6-7.   New Medications:  Meds ordered this encounter  Medications   predniSONE (STERAPRED UNI-PAK 21 TAB) 10 MG (21) TBPK tablet    Sig: 10 mg DS 12 as directed    Dispense:  48 tablet    Refill:  0      Mordecai Rasmussen, MD  12/15/2022 1:47 PM

## 2022-12-27 ENCOUNTER — Ambulatory Visit: Payer: Self-pay | Admitting: Orthopedic Surgery

## 2022-12-28 ENCOUNTER — Ambulatory Visit: Payer: Self-pay | Admitting: Orthopedic Surgery

## 2022-12-30 ENCOUNTER — Encounter: Payer: Self-pay | Admitting: Orthopedic Surgery

## 2022-12-30 ENCOUNTER — Ambulatory Visit (INDEPENDENT_AMBULATORY_CARE_PROVIDER_SITE_OTHER): Payer: Self-pay | Admitting: Orthopedic Surgery

## 2022-12-30 VITALS — BP 121/92 | HR 95 | Ht 71.0 in | Wt 188.4 lb

## 2022-12-30 DIAGNOSIS — M778 Other enthesopathies, not elsewhere classified: Secondary | ICD-10-CM

## 2022-12-30 DIAGNOSIS — M25511 Pain in right shoulder: Secondary | ICD-10-CM

## 2022-12-30 MED ORDER — PREDNISONE 10 MG (48) PO TBPK
ORAL_TABLET | Freq: Every day | ORAL | 0 refills | Status: DC
Start: 2022-12-30 — End: 2023-01-13

## 2022-12-30 MED ORDER — METHYLPREDNISOLONE ACETATE 40 MG/ML IJ SUSP
40.0000 mg | Freq: Once | INTRAMUSCULAR | Status: AC
  Administered 2022-12-30: 40 mg via INTRA_ARTICULAR

## 2022-12-30 NOTE — Patient Instructions (Signed)
Out of work note x 2 weeks

## 2022-12-30 NOTE — Progress Notes (Signed)
Chief Complaint  Patient presents with   Shoulder Pain    Bilateral shoulder pain RT is still painful after finishing round of prednisone. 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 took a steroid Unipak the left shoulder got better but the right shoulder did not  The pain is actually in the shoulder radiates to but not below the elbow still on the right  Examination reveals tenderness around the anterior part of the right shoulder pain with supraspinatus resistance testing as well as abduction resistance with painful external rotation and decreased internal rotation consistent with impingement syndrome  Imaging was negative on previous visit  Recommend subacromial injection right shoulder  Sterapred double strength Dosepak for 12 days  Out of work 2 weeks  Follow-up 2 weeks   Procedure note the subacromial injection shoulder RIGHT    Verbal consent was obtained to inject the  RIGHT   Shoulder  Timeout was completed to confirm the injection site is a subacromial space of the  RIGHT  shoulder   Medication used Depo-Medrol 40 mg and lidocaine 1% 3 cc  Anesthesia was provided by ethyl chloride  The injection was performed in the RIGHT  posterior subacromial space. After pinning the skin with alcohol and anesthetized the skin with ethyl chloride the subacromial space was injected using a 20-gauge needle. There were no complications  Sterile dressing was applied.

## 2023-01-13 ENCOUNTER — Encounter: Payer: Self-pay | Admitting: Orthopedic Surgery

## 2023-01-13 ENCOUNTER — Ambulatory Visit (INDEPENDENT_AMBULATORY_CARE_PROVIDER_SITE_OTHER): Payer: Self-pay | Admitting: Orthopedic Surgery

## 2023-01-13 DIAGNOSIS — M778 Other enthesopathies, not elsewhere classified: Secondary | ICD-10-CM

## 2023-01-13 MED ORDER — PREDNISONE 10 MG (48) PO TBPK
ORAL_TABLET | Freq: Every day | ORAL | 0 refills | Status: DC
Start: 2023-01-13 — End: 2023-02-18

## 2023-01-13 NOTE — Progress Notes (Signed)
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 of prednisone  He does have some chest tenderness which is in his pectoralis and is brought on and reproduced with abduction against resistance and there is some tenderness there however, the shoulder pain right and left have resolved he has full range of motion he has excellent cuff strength in abduction and flexion  A couple more days of rest and he should be able to return to work we will update his note to include Monday and put a prednisone prescription at the pharmacy which she can start if his arm starts to hurt again  Meds ordered this encounter  Medications   predniSONE (STERAPRED UNI-PAK 48 TAB) 10 MG (48) TBPK tablet    Sig: Take by mouth daily. 10 MG 12 DAYS AS DIRECTED    Dispense:  48 tablet    Refill:  0

## 2023-01-16 ENCOUNTER — Other Ambulatory Visit: Payer: Self-pay | Admitting: Internal Medicine

## 2023-01-16 DIAGNOSIS — K219 Gastro-esophageal reflux disease without esophagitis: Secondary | ICD-10-CM

## 2023-02-18 ENCOUNTER — Ambulatory Visit (INDEPENDENT_AMBULATORY_CARE_PROVIDER_SITE_OTHER): Payer: Self-pay | Admitting: Internal Medicine

## 2023-02-18 ENCOUNTER — Encounter: Payer: Self-pay | Admitting: Internal Medicine

## 2023-02-18 VITALS — BP 111/75 | HR 60 | Ht 71.0 in | Wt 189.2 lb

## 2023-02-18 DIAGNOSIS — E782 Mixed hyperlipidemia: Secondary | ICD-10-CM

## 2023-02-18 DIAGNOSIS — R7303 Prediabetes: Secondary | ICD-10-CM

## 2023-02-18 DIAGNOSIS — K219 Gastro-esophageal reflux disease without esophagitis: Secondary | ICD-10-CM

## 2023-02-18 DIAGNOSIS — M778 Other enthesopathies, not elsewhere classified: Secondary | ICD-10-CM | POA: Insufficient documentation

## 2023-02-18 DIAGNOSIS — Z0001 Encounter for general adult medical examination with abnormal findings: Secondary | ICD-10-CM

## 2023-02-18 MED ORDER — PANTOPRAZOLE SODIUM 40 MG PO TBEC: 40.0000 mg | DELAYED_RELEASE_TABLET | Freq: Every day | ORAL | 3 refills | Status: DC

## 2023-02-18 NOTE — Progress Notes (Signed)
Established Patient Office Visit  Subjective:  Patient ID: Isaac Hubbard, male    DOB: 07/15/1981  Age: 42 y.o. MRN: [id number]  CC:  Chief Complaint  Patient presents with   Annual Exam    HPI Isaac Hubbard is a 42 y.o. male with past medical history of GERD, prediabetes and HLD who presents for annual physical.  GERD: He takes pantoprazole for it.  He denies any nausea, vomiting, dysphagia or odynophagia.   Prediabetes: His HbA1c was 5.9 in the last visit.  His last CMP showed blood glucose of 220 when he went to ER for headache.  He currently denies any polyuria or polyphagia.  He has history of HLD.  He has right sided shoulder pain since 11/26/22.  He has seen orthopedic surgeon and received subacromial injection for tendinitis, but has had recurrence of shoulder pain.  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  He also has history of cervical disc herniation in 2015.  He has been prescribed 2 rounds of oral steroids, but his pain is recurrent.  Denies any numbness or tingling of the UE.  Past Medical History:  Diagnosis Date   Hyperlipidemia    Nicotine dependence 08/31/2015   Prediabetes 08/2015   lifestyle/ dietary change    Past Surgical History:  Procedure Laterality Date   APPENDECTOMY     BIOPSY  08/23/2018   Procedure: BIOPSY;  Surgeon: Corbin Ade, MD;  Location: [facility];  Service: Endoscopy;;  (gastric)   ESOPHAGOGASTRODUODENOSCOPY N/A 08/23/2018   Procedure: ESOPHAGOGASTRODUODENOSCOPY (EGD);  Surgeon: Corbin Ade, MD;  Location: [facility];  Service: Endoscopy;  Laterality: N/A;   SPINE SURGERY  2015   c spine   TONSILLECTOMY      Family History  Problem Relation Age of Onset   Diabetes Father    Hypertension Father    Arthritis Father    Hypertension Paternal Grandmother     Social History   Socioeconomic History   Marital status: Single     Spouse name: Not on file   Number of children: Not on file   Years of education: Not on file   Highest education level: Not on file  Occupational History   Not on file  Tobacco Use   Smoking status: Former    Packs/day: .5    Types: Cigarettes    Quit date: 2020    Years since quitting: 4.4   Smokeless tobacco: Never  Vaping Use   Vaping Use: Never used  Substance and Sexual Activity   Alcohol use: No   Drug use: No   Sexual activity: Yes  Other Topics Concern   Not on file  Social History Narrative   Not on file   Social Determinants of Health   Financial Resource Strain: Not on file  Food Insecurity: Not on file  Transportation Needs: Not on file  Physical Activity: Not on file  Stress: Not on file  Social Connections: Not on file  Intimate Partner Violence: Not on file    Outpatient Medications Prior to Visit  Medication Sig Dispense Refill   pseudoephedrine (SUDAFED) 30 MG tablet Take 30 mg by mouth every 4 (four) hours as needed for congestion.     pantoprazole (PROTONIX) 40 MG tablet TAKE 1 TABLET BY MOUTH ONCE DAILY BEFORE BREAKFAST 90 tablet 0   acetaminophen (TYLENOL) 325 MG tablet Take 1,000 mg by mouth  every 6 (six) hours as needed for headache. (Patient not taking: Reported on 02/05/2022)     predniSONE (STERAPRED UNI-PAK 48 TAB) 10 MG (48) TBPK tablet Take by mouth daily. 10 MG 12 DAYS AS DIRECTED (Patient not taking: Reported on 02/18/2023) 48 tablet 0   No facility-administered medications prior to visit.    No Known Allergies  ROS Review of Systems  Constitutional:  Negative for chills and fever.  HENT:  Negative for congestion and sore throat.   Eyes:  Negative for pain and discharge.  Respiratory:  Negative for cough and shortness of breath.   Cardiovascular:  Negative for chest pain and palpitations.  Gastrointestinal:  Negative for constipation, diarrhea, nausea and vomiting.  Endocrine: Negative for polydipsia and polyuria.  Genitourinary:   Negative for dysuria and hematuria.  Musculoskeletal:  Negative for neck pain and neck stiffness.       Right shoulder pain  Skin:  Negative for rash.  Neurological:  Negative for dizziness, weakness, numbness and headaches.  Psychiatric/Behavioral:  Negative for agitation and behavioral problems.       Objective:    Physical Exam Vitals reviewed.  Constitutional:      General: He is not in acute distress.    Appearance: He is not diaphoretic.  HENT:     Head: Normocephalic and atraumatic.     Nose: Nose normal.     Mouth/Throat:     Mouth: Mucous membranes are moist.  Eyes:     General: No scleral icterus.    Extraocular Movements: Extraocular movements intact.  Cardiovascular:     Rate and Rhythm: Normal rate and regular rhythm.     Pulses: Normal pulses.     Heart sounds: Normal heart sounds. No murmur heard. Pulmonary:     Breath sounds: Normal breath sounds. No wheezing or rales.  Abdominal:     Palpations: Abdomen is soft.     Tenderness: There is no abdominal tenderness.  Musculoskeletal:     Right shoulder: Tenderness (Anterior aspect) present. Decreased range of motion.     Cervical back: Neck supple. No tenderness.     Right lower leg: No edema.     Left lower leg: No edema.  Skin:    General: Skin is warm.     Findings: No rash.  Neurological:     General: No focal deficit present.     Mental Status: He is alert and oriented to person, place, and time.     Sensory: No sensory deficit.     Motor: No weakness.  Psychiatric:        Mood and Affect: Mood normal.        Behavior: Behavior normal.     BP 111/75 (BP Location: Right Arm, Patient Position: Sitting, Cuff Size: Normal)   Pulse 60   Ht 5\' 11"  (1.803 m)   Wt 189 lb 3.2 oz (85.8 kg)   SpO2 96%   BMI 26.39 kg/m  Wt Readings from Last 3 Encounters:  02/18/23 189 lb 3.2 oz (85.8 kg)  12/30/22 188 lb 6.4 oz (85.5 kg)  12/14/22 190 lb (86.2 kg)    Lab Results  Component Value Date   TSH  1.416 08/23/2015   Lab Results  Component Value Date   WBC 12.1 (H) 01/01/2022   HGB 15.1 01/01/2022   HCT 41.9 01/01/2022   MCV 91.3 01/01/2022   PLT 234 01/01/2022   Lab Results  Component Value Date   NA 134 (L) 01/01/2022   K  4.0 01/01/2022   CO2 26 01/01/2022   GLUCOSE 220 (H) 01/01/2022   BUN 15 01/01/2022   CREATININE 1.14 01/01/2022   BILITOT 0.5 08/23/2015   ALKPHOS 70 08/23/2015   AST 27 08/23/2015   ALT 31 08/23/2015   PROT 7.4 08/23/2015   ALBUMIN 4.8 08/23/2015   CALCIUM 9.8 01/01/2022   ANIONGAP 7 01/01/2022   Lab Results  Component Value Date   CHOL 209 (H) 08/23/2015   Lab Results  Component Value Date   HDL 41 08/23/2015   Lab Results  Component Value Date   LDLCALC 140 (H) 08/23/2015   Lab Results  Component Value Date   TRIG 139 08/23/2015   Lab Results  Component Value Date   CHOLHDL 5.1 (H) 08/23/2015   Lab Results  Component Value Date   HGBA1C 5.8 02/05/2022      Assessment & Plan:   Problem List Items Addressed This Visit       Digestive   Gastroesophageal reflux disease    Well controlled with pantoprazole, refilled      Relevant Medications   pantoprazole (PROTONIX) 40 MG tablet   Other Relevant Orders   CBC with Differential/Platelet     Musculoskeletal and Integument   Tendinitis of right shoulder    Right shoulder pain with tenderness and decreased ROM Unclear if tendinitis versus rotator cuff syndrome Needs to follow up with orthopedic surgeon Offered OT, but he prefers to wait for now Avoid repetitive oral steroids due to risk of hyperglycemia Advised to try Tylenol arthritis         Other   Encounter for general adult medical examination with abnormal findings - Primary    Physical exam as documented. Fasting blood tests today. Needs Tdap vaccine, but he prefers to wait till he gets insurance.      Prediabetes    Lab Results  Component Value Date   HGBA1C 5.8 02/05/2022  Had recent oral  steroids, which could cause hypoglycemia Advised to follow low-carb diet and perform moderate exercise Check CMP and hemoglobin A1c      Relevant Orders   Hemoglobin A1c   CMP14+EGFR   Hyperlipidemia    Check lipid profile Advised to follow low-cholesterol diet for now      Relevant Orders   Lipid panel    Meds ordered this encounter  Medications   pantoprazole (PROTONIX) 40 MG tablet    Sig: Take 1 tablet (40 mg total) by mouth daily before breakfast.    Dispense:  90 tablet    Refill:  3    Follow-up: Return in about 1 year (around 02/18/2024) for Annual physical.    Anabel Halon, MD

## 2023-02-18 NOTE — Assessment & Plan Note (Signed)
Right shoulder pain with tenderness and decreased ROM Unclear if tendinitis versus rotator cuff syndrome Needs to follow up with orthopedic surgeon Offered OT, but he prefers to wait for now Avoid repetitive oral steroids due to risk of hyperglycemia Advised to try Tylenol arthritis

## 2023-02-18 NOTE — Assessment & Plan Note (Signed)
Check lipid profile Advised to follow low cholesterol diet for now 

## 2023-02-18 NOTE — Assessment & Plan Note (Signed)
Lab Results  Component Value Date   HGBA1C 5.8 02/05/2022   Had recent oral steroids, which could cause hypoglycemia Advised to follow low-carb diet and perform moderate exercise Check CMP and hemoglobin A1c

## 2023-02-18 NOTE — Assessment & Plan Note (Signed)
Well controlled with pantoprazole, refilled 

## 2023-02-18 NOTE — Assessment & Plan Note (Signed)
Physical exam as documented. Fasting blood tests today. Needs Tdap vaccine, but he prefers to wait till he gets insurance.

## 2023-02-18 NOTE — Patient Instructions (Signed)
Please take Tylenol arthritis as needed for shoulder pain.  Please continue to take medications as prescribed.  Please continue to follow low carb diet and perform moderate exercise/walking at least 150 mins/week.

## 2023-02-19 LAB — CBC WITH DIFFERENTIAL/PLATELET
Basophils Absolute: 0 10*3/uL (ref 0.0–0.2)
Basos: 0 %
EOS (ABSOLUTE): 0.1 10*3/uL (ref 0.0–0.4)
Eos: 1 %
Hematocrit: 41.5 % (ref 37.5–51.0)
Hemoglobin: 14.5 g/dL (ref 13.0–17.7)
Immature Grans (Abs): 0 10*3/uL (ref 0.0–0.1)
Immature Granulocytes: 0 %
Lymphocytes Absolute: 2.2 10*3/uL (ref 0.7–3.1)
Lymphs: 38 %
MCH: 31.7 pg (ref 26.6–33.0)
MCHC: 34.9 g/dL (ref 31.5–35.7)
MCV: 91 fL (ref 79–97)
Monocytes Absolute: 0.5 10*3/uL (ref 0.1–0.9)
Monocytes: 9 %
Neutrophils Absolute: 2.9 10*3/uL (ref 1.4–7.0)
Neutrophils: 52 %
Platelets: 182 10*3/uL (ref 150–450)
RBC: 4.58 x10E6/uL (ref 4.14–5.80)
RDW: 13.1 % (ref 11.6–15.4)
WBC: 5.7 10*3/uL (ref 3.4–10.8)

## 2023-02-19 LAB — LIPID PANEL
Chol/HDL Ratio: 5.4 ratio — ABNORMAL HIGH (ref 0.0–5.0)
Cholesterol, Total: 206 mg/dL — ABNORMAL HIGH (ref 100–199)
HDL: 38 mg/dL — ABNORMAL LOW (ref >39)
LDL Chol Calc (NIH): 118 mg/dL — ABNORMAL HIGH (ref 0–99)
Triglycerides: 284 mg/dL — ABNORMAL HIGH (ref 0–149)
VLDL Cholesterol Cal: 50 mg/dL — ABNORMAL HIGH (ref 5–40)

## 2023-02-19 LAB — CMP14+EGFR
ALT: 41 IU/L (ref 0–44)
AST: 27 IU/L (ref 0–40)
Albumin/Globulin Ratio: 2 (ref 1.2–2.2)
Albumin: 4.9 g/dL (ref 4.1–5.1)
Alkaline Phosphatase: 68 IU/L (ref 44–121)
BUN/Creatinine Ratio: 19 (ref 9–20)
BUN: 18 mg/dL (ref 6–24)
Bilirubin Total: 0.2 mg/dL (ref 0.0–1.2)
CO2: 23 mmol/L (ref 20–29)
Calcium: 9.9 mg/dL (ref 8.7–10.2)
Chloride: 103 mmol/L (ref 96–106)
Creatinine, Ser: 0.95 mg/dL (ref 0.76–1.27)
Globulin, Total: 2.4 g/dL (ref 1.5–4.5)
Glucose: 98 mg/dL (ref 70–99)
Potassium: 4.3 mmol/L (ref 3.5–5.2)
Sodium: 140 mmol/L (ref 134–144)
Total Protein: 7.3 g/dL (ref 6.0–8.5)
eGFR: 102 mL/min/{1.73_m2} (ref >59)

## 2023-02-19 LAB — HEMOGLOBIN A1C
Est. average glucose Bld gHb Est-mCnc: 134 mg/dL
Hgb A1c MFr Bld: 6.3 % — ABNORMAL HIGH (ref 4.8–5.6)

## 2023-03-03 ENCOUNTER — Encounter: Payer: Self-pay | Admitting: Orthopedic Surgery

## 2023-03-03 ENCOUNTER — Ambulatory Visit: Payer: Self-pay | Admitting: Orthopedic Surgery

## 2023-03-03 VITALS — BP 108/73 | HR 78 | Ht 71.0 in | Wt 189.0 lb

## 2023-03-03 DIAGNOSIS — M75121 Complete rotator cuff tear or rupture of right shoulder, not specified as traumatic: Secondary | ICD-10-CM

## 2023-03-03 DIAGNOSIS — M25511 Pain in right shoulder: Secondary | ICD-10-CM

## 2023-03-03 DIAGNOSIS — S43431A Superior glenoid labrum lesion of right shoulder, initial encounter: Secondary | ICD-10-CM

## 2023-03-03 DIAGNOSIS — G8929 Other chronic pain: Secondary | ICD-10-CM

## 2023-03-03 NOTE — Patient Instructions (Signed)
Dr Romeo Apple ordered MRI please go ahead and call to schedule your appointment with [facility] within at least one (1) week. for [employer] [phone]

## 2023-03-03 NOTE — Progress Notes (Signed)
Chief Complaint  Patient presents with   Shoulder Pain    Both, mom made appointment states Dr Romeo Apple is supposed to check both shoulders     42 year old male reevaluation bilateral shoulder pain but really related to right shoulder which is the more symptomatic shoulder.  Symptoms:  Nocturnal pain  Pain with weight in right hand which is worse with lifting Burning pain "inside" the shoulder    Exam findings:   No atrophy  No neck pain or tenderness  Pain with external rotation @ 0 degrees PROM is normal except it hurts   There is no rotator cuff weakness to MMT but pain  This worsens with the palm up   No ev of instab.  Rec MRI with contrast   Encounter Diagnoses  Name Primary?   Chronic right shoulder pain Yes   Superior glenoid labrum lesion of right shoulder, initial encounter    Nontraumatic complete tear of right rotator cuff       RCT  Labral tear  SLAP

## 2023-03-28 ENCOUNTER — Ambulatory Visit (HOSPITAL_COMMUNITY)
Admission: RE | Admit: 2023-03-28 | Discharge: 2023-03-28 | Disposition: A | Discharge status: Home / Self Care | Payer: Self-pay | Source: Ambulatory Visit | Attending: Orthopedic Surgery | Admitting: Orthopedic Surgery

## 2023-03-28 DIAGNOSIS — M25511 Pain in right shoulder: Secondary | ICD-10-CM | POA: Insufficient documentation

## 2023-03-28 DIAGNOSIS — G8929 Other chronic pain: Secondary | ICD-10-CM | POA: Insufficient documentation

## 2023-03-28 MED ORDER — IOHEXOL 180 MG/ML  SOLN
15.0000 mL | Freq: Once | INTRAMUSCULAR | Status: AC | PRN
  Administered 2023-03-28: 15 mL via INTRA_ARTICULAR

## 2023-03-28 MED ORDER — STERILE WATER FOR INJECTION IJ SOLN
INTRAMUSCULAR | Status: AC
  Filled 2023-03-28: qty 10

## 2023-03-28 MED ORDER — GADOBUTROL 1 MMOL/ML IV SOLN
0.0500 mL | Freq: Once | INTRAVENOUS | Status: AC | PRN
  Administered 2023-03-28: 0.05 mL

## 2023-03-28 MED ORDER — LIDOCAINE 1 % OPTIME INJ - NO CHARGE
5.0000 mL | Freq: Once | INTRAMUSCULAR | Status: DC
  Filled 2023-03-28: qty 6

## 2023-03-28 MED ORDER — LIDOCAINE HCL (PF) 1 % IJ SOLN
5.0000 mL | Freq: Once | INTRAMUSCULAR | Status: AC
  Administered 2023-03-28: 5 mL via INTRADERMAL

## 2023-03-28 MED ORDER — LIDOCAINE HCL (PF) 1 % IJ SOLN
INTRAMUSCULAR | Status: AC
  Filled 2023-03-28: qty 5

## 2023-03-28 MED ORDER — SODIUM CHLORIDE (PF) 0.9% IJ SOLUTION - NO CHARGE
5.0000 mL | Freq: Once | INTRAMUSCULAR | Status: AC
  Administered 2023-03-28: 5 mL
  Filled 2023-03-28: qty 6

## 2023-03-30 ENCOUNTER — Ambulatory Visit (HOSPITAL_COMMUNITY): Payer: Self-pay

## 2023-04-08 ENCOUNTER — Ambulatory Visit (INDEPENDENT_AMBULATORY_CARE_PROVIDER_SITE_OTHER): Payer: Self-pay | Admitting: Orthopedic Surgery

## 2023-04-08 DIAGNOSIS — M7521 Bicipital tendinitis, right shoulder: Secondary | ICD-10-CM

## 2023-04-08 DIAGNOSIS — M75111 Incomplete rotator cuff tear or rupture of right shoulder, not specified as traumatic: Secondary | ICD-10-CM

## 2023-04-08 DIAGNOSIS — G8929 Other chronic pain: Secondary | ICD-10-CM

## 2023-04-08 NOTE — Progress Notes (Signed)
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  The contrast was not injected into the joint so we did not get a contrast MRI.  The MRI shows a small partial tear with interstitial tear rotator cuff some biceps tendinitis no full-thickness tear  I have read the x-ray I agree that there is no evidence of a full-thickness tear  Recommend home exercise program as the patient's pain is actually gotten better  Recommend stretching strengthening for 6 weeks and then recheck  Partial rotator cuff tear right shoulder tendinitis of the biceps  Encounter Diagnoses  Name Primary?   Chronic right shoulder pain Yes   Nontraumatic incomplete tear of right rotator cuff    Biceps tendinitis of right shoulder

## 2023-05-20 ENCOUNTER — Ambulatory Visit: Payer: Self-pay | Admitting: Orthopedic Surgery

## 2023-10-15 ENCOUNTER — Encounter (HOSPITAL_BASED_OUTPATIENT_CLINIC_OR_DEPARTMENT_OTHER): Payer: Self-pay

## 2023-10-15 ENCOUNTER — Emergency Department (HOSPITAL_BASED_OUTPATIENT_CLINIC_OR_DEPARTMENT_OTHER)
Admission: EM | Admit: 2023-10-15 | Discharge: 2023-10-15 | Disposition: A | Discharge status: Home / Self Care | Payer: Self-pay | Attending: Emergency Medicine | Admitting: Emergency Medicine

## 2023-10-15 ENCOUNTER — Other Ambulatory Visit: Payer: Self-pay

## 2023-10-15 DIAGNOSIS — R5381 Other malaise: Secondary | ICD-10-CM | POA: Insufficient documentation

## 2023-10-15 DIAGNOSIS — R059 Cough, unspecified: Secondary | ICD-10-CM | POA: Insufficient documentation

## 2023-10-15 DIAGNOSIS — R519 Headache, unspecified: Secondary | ICD-10-CM | POA: Insufficient documentation

## 2023-10-15 DIAGNOSIS — J101 Influenza due to other identified influenza virus with other respiratory manifestations: Secondary | ICD-10-CM

## 2023-10-15 DIAGNOSIS — Z20822 Contact with and (suspected) exposure to covid-19: Secondary | ICD-10-CM | POA: Insufficient documentation

## 2023-10-15 DIAGNOSIS — R509 Fever, unspecified: Secondary | ICD-10-CM | POA: Insufficient documentation

## 2023-10-15 DIAGNOSIS — R5383 Other fatigue: Secondary | ICD-10-CM | POA: Insufficient documentation

## 2023-10-15 LAB — RESP PANEL BY RT-PCR (RSV, FLU A&B, COVID)  RVPGX2
Influenza A by PCR: POSITIVE — AB
Influenza B by PCR: NEGATIVE
Resp Syncytial Virus by PCR: NEGATIVE
SARS Coronavirus 2 by RT PCR: NEGATIVE

## 2023-10-15 MED ORDER — KETOROLAC TROMETHAMINE 60 MG/2ML IM SOLN
15.0000 mg | Freq: Once | INTRAMUSCULAR | Status: AC
  Administered 2023-10-15: 15 mg via INTRAMUSCULAR
  Filled 2023-10-15: qty 2

## 2023-10-15 MED ORDER — NAPROXEN 500 MG PO TABS: 500.0000 mg | ORAL_TABLET | Freq: Two times a day (BID) | ORAL | 0 refills | Status: DC

## 2023-10-15 MED ORDER — PROMETHAZINE-DM 6.25-15 MG/5ML PO SYRP: 5.0000 mL | ORAL_SOLUTION | Freq: Four times a day (QID) | ORAL | 0 refills | Status: DC | PRN

## 2023-10-15 NOTE — ED Provider Notes (Signed)
[place] EMERGENCY DEPARTMENT AT [facility] Provider Note   CSN: [id number] Arrival date & time: 10/15/23  1206     History  Chief Complaint  Patient presents with   Fever   Headache    Isaac Hubbard is a 43 y.o. male.  Patient is a 43 year old male with no pertinent past medical history who presents to the emergency department with a chief complaint of fever, headache, generalized malaise and fatigue, body aches and mild cough which has been ongoing since last night.  He does admit to a known exposure to his child who was sick with influenza.  He notes he has had no associated abdominal pain, nausea, vomiting, diarrhea.  He denies any associated chest pain or shortness of breath.  He is having no pain to his neck or back and denies any associated abnormal rashes.  He denies any dizziness, lightheadedness or syncope.   Fever Associated symptoms: cough and headaches   Headache Associated symptoms: cough and fever        Home Medications Prior to Admission medications   Medication Sig Start Date End Date Taking? Authorizing Provider  acetaminophen (TYLENOL) 325 MG tablet Take 1,000 mg by mouth every 6 (six) hours as needed for headache.    [provider]  loratadine (CLARITIN) 10 MG tablet Take 10 mg by mouth daily.    [provider]  pantoprazole (PROTONIX) 40 MG tablet Take 1 tablet (40 mg total) by mouth daily before breakfast. 02/18/23   Anabel Halon, MD  pseudoephedrine (SUDAFED) 30 MG tablet Take 30 mg by mouth every 4 (four) hours as needed for congestion. Patient not taking: Reported on 04/08/2023    [provider]      Allergies    Patient has no known allergies.    Review of Systems   Review of Systems  Constitutional:  Positive for fever.  Respiratory:  Positive for cough.   Neurological:  Positive for headaches.    Physical Exam Updated Vital Signs BP 120/85 (BP Location: Right Arm)   Pulse (!) 111   Temp 99.2 F  (37.3 C) (Oral)   Resp 20   Ht 5\' 11"  (1.803 m)   Wt 83.9 kg   SpO2 96%   BMI 25.80 kg/m  Physical Exam Constitutional:      Appearance: Normal appearance.  HENT:     Head: Normocephalic and atraumatic.     Nose: Nose normal.     Mouth/Throat:     Mouth: Mucous membranes are moist.     Pharynx: No pharyngeal swelling, oropharyngeal exudate, posterior oropharyngeal erythema or uvula swelling.  Eyes:     Extraocular Movements: Extraocular movements intact.     Conjunctiva/sclera: Conjunctivae normal.     Pupils: Pupils are equal, round, and reactive to light.  Cardiovascular:     Rate and Rhythm: Normal rate and regular rhythm.     Pulses: Normal pulses.     Heart sounds: Normal heart sounds.  Pulmonary:     Effort: Pulmonary effort is normal.     Breath sounds: Normal breath sounds.  Abdominal:     General: Abdomen is flat. Bowel sounds are normal.     Palpations: Abdomen is soft.  Musculoskeletal:        General: Normal range of motion.     Cervical back: Normal range of motion and neck supple. No rigidity.  Lymphadenopathy:     Cervical: No cervical adenopathy.  Skin:    General: Skin is warm  and dry.  Neurological:     General: No focal deficit present.     Mental Status: He is alert and oriented to person, place, and time. Mental status is at baseline.     GCS: GCS eye subscore is 4. GCS verbal subscore is 5. GCS motor subscore is 6.     Cranial Nerves: No cranial nerve deficit or dysarthria.  Psychiatric:        Mood and Affect: Mood normal.        Behavior: Behavior normal.        Thought Content: Thought content normal.        Judgment: Judgment normal.     ED Results / Procedures / Treatments   Labs (all labs ordered are listed, but only abnormal results are displayed) Labs Reviewed  RESP PANEL BY RT-PCR (RSV, FLU A&B, COVID)  RVPGX2 - Abnormal; Notable for the following components:      Result Value   Influenza A by PCR POSITIVE (*)    All other  components within normal limits    EKG None  Radiology No results found.  Procedures Procedures    Medications Ordered in ED Medications  ketorolac (TORADOL) injection 15 mg (15 mg Intramuscular Given 10/15/23 1413)    ED Course/ Medical Decision Making/ A&P                                 Medical Decision Making Patient is doing well at this time and is stable for discharge home.  Discussed with patient he is positive for influenza A.  He has stable vital signs at this point with no indication for sepsis and no associated hypoxia.  Risk versus benefit of Tamiflu was discussed with patient and he is in agreement to forego this medication at this time.  Patient is otherwise low risk for acute complications secondary to his influenza.  Continue symptomatic treatment and outpatient basis was discussed.  Patient had no focal consolidations noted with auscultation of the lungs.  Strict return precautions were provided for any new or worsening symptoms.  Patient voiced understand to the plan and had no additional questions.  Risk Prescription drug management.           Final Clinical Impression(s) / ED Diagnoses Final diagnoses:  None    Rx / DC Orders ED Discharge Orders     None         Lelon Perla, PA-C 10/15/23 1425    Horton, Clabe Seal, DO 10/16/23 0915

## 2023-10-15 NOTE — ED Triage Notes (Signed)
Patient arrives with complaints of headache, chills, and fever x1 day.  Reports he has a child at home recovering from the flu.

## 2023-10-15 NOTE — Discharge Instructions (Addendum)
 Please follow-up closely with your primary care doctor on an outpatient basis.  Return to emergency department immediately for any new or worsening symptoms.

## 2024-02-10 ENCOUNTER — Encounter (INDEPENDENT_AMBULATORY_CARE_PROVIDER_SITE_OTHER): Payer: Self-pay | Admitting: Physician Assistant

## 2024-02-10 ENCOUNTER — Ambulatory Visit (INDEPENDENT_AMBULATORY_CARE_PROVIDER_SITE_OTHER): Payer: Self-pay | Admitting: Physician Assistant

## 2024-02-10 VITALS — BP 130/83 | HR 75 | Ht 71.0 in | Wt 185.0 lb

## 2024-02-10 DIAGNOSIS — H66001 Acute suppurative otitis media without spontaneous rupture of ear drum, right ear: Secondary | ICD-10-CM

## 2024-02-10 MED ORDER — FLUTICASONE PROPIONATE 50 MCG/ACT NA SUSP: 2.0000 | Freq: Every day | NASAL | 6 refills | Status: AC

## 2024-02-10 MED ORDER — LORATADINE 10 MG PO TABS: 10.0000 mg | ORAL_TABLET | Freq: Every day | ORAL | 11 refills | Status: AC

## 2024-02-10 NOTE — Progress Notes (Signed)
 Dear Dr. Lydia Hubbard, Here is my assessment for our mutual patient, Isaac Hubbard. Thank you for allowing me the opportunity to care for your patient. Please do not hesitate to contact me should you have any other questions. Sincerely, Isaac Boxer PA-C  Otolaryngology Clinic Note Referring provider: Dr. Lydia Hubbard HPI:  Isaac Hubbard is a 43 y.o. male kindly referred by Dr. Lydia Hubbard   The patient is a 43 year old gentleman seen in our office for evaluation of otitis media.  The patient notes no significant ear related history or problems, no history of recurrent ear infections as a kid.  No head or neck surgeries.  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  He notes his baseline is decreased hearing worse on the left compared to the right.  He notes approximately 2 weeks ago he started having some fullness and pain in his ears.  He notes he developed rather severe right sided pain.  He notes preceding this he did have a fever for several days and felt very bad.  Even the ongoing pain he was seen in urgent care and was diagnosed with acute otitis media on the right.  He was started on amoxicillin  given Toradol .  He notes the pain has improved, he denies any subsequent fevers.  He notes decreased hearing out of the right when compared to the left.  He notes the hearing was initially completely absent but has returned somewhat but not back to its baseline.  He denies any dizziness or ringing in his ears.  He does a history of seasonal allergies, he is not currently taking any antihistamines.   Independent Review of Additional Tests or Records:  Urgent care note on 02/07/2024   PMH/Meds/All/SocHx/FamHx/ROS:   Past Medical History:  Diagnosis Date   Hyperlipidemia    Nicotine  dependence 08/31/2015   Prediabetes 08/2015   lifestyle/ dietary change     Past Surgical History:  Procedure Laterality Date   APPENDECTOMY     BIOPSY   08/23/2018   Procedure: BIOPSY;  Surgeon: Suzette Espy, MD;  [facility]: [facility];  Service: Endoscopy;;  (gastric)   ESOPHAGOGASTRODUODENOSCOPY N/A 08/23/2018   Procedure: ESOPHAGOGASTRODUODENOSCOPY (EGD);  Surgeon: Suzette Espy, MD;  [facility]: [facility];  Service: Endoscopy;  Laterality: N/A;   SPINE SURGERY  2015   c spine   TONSILLECTOMY      Family History  Problem Relation Age of Onset   Diabetes Father    Hypertension Father    Arthritis Father    Hypertension Paternal Grandmother      Social Connections: Not on file      Current Outpatient Medications:    acetaminophen  (TYLENOL ) 325 MG tablet, Take 1,000 mg by mouth every 6 (six) hours as needed for headache., Disp: , Rfl:    loratadine (CLARITIN) 10 MG tablet, Take 10 mg by mouth daily., Disp: , Rfl:    naproxen  (NAPROSYN ) 500 MG tablet, Take 1 tablet (500 mg total) by mouth 2 (two) times daily., Disp: 20 tablet, Rfl: 0   pantoprazole  (PROTONIX ) 40 MG tablet, Take 1 tablet (40 mg total) by mouth daily before breakfast., Disp: 90 tablet, Rfl: 3   promethazine -dextromethorphan (PROMETHAZINE -DM) 6.25-15 MG/5ML syrup, Take 5 mLs by mouth 4 (four) times daily as needed for cough., Disp: 118 mL, Rfl: 0   pseudoephedrine  (SUDAFED) 30 MG tablet, Take 30 mg by mouth every 4 (four) hours  as needed for congestion. (Patient not taking: Reported on 02/10/2024), Disp: , Rfl:    Physical Exam:   BP 130/83   Pulse 75   Ht 5\' 11"  (1.803 m)   Wt 185 lb (83.9 kg)   SpO2 93%   BMI 25.80 kg/m   Pertinent Findings  CN II-XII intact Bilateral EAC clear, right TM with mucoid effusion no erythema, left TM intact with well pneumatized middle ear space Weber 512: Left localization Rinne 512: AC > BC b/l  Anterior rhinoscopy: Septum midline; bilateral inferior turbinates with hypertrophy No lesions of oral cavity/oropharynx; dentition within normal limits No obviously palpable neck masses/lymphadenopathy/thyromegaly No  respiratory distress or stridor   Seprately Identifiable Procedures:  None  Impression & Plans:  Isaac Hubbard is a 43 y.o. male with the following   Otitis media-  This is a 43 year old gentleman seen in our office for follow-up evaluation of right sided otitis media.  The patient has mucoid effusion today, no further signs of infection.  He is currently taking amoxicillin , have encouraged him to continue taking this medication.  I have also recommended he initiate daily antihistamine with Claritin, Flonase  daily, and nasal saline irrigation.  Given the effusion and the hearing loss I have recommended audiological evaluation in the next several weeks.  I would like to see him back in the office after audiological evaluation for further evaluation management.  If he continues to have effusion I will perform nasal endoscopy.  This was likely secondary to acute otitis media given the fever and right sided complaints.  The patient verbalized understanding and agreement to today's plan had no further questions or concerns.   - f/u 2 weeks with audiological evaluation and office visit   Thank you for allowing me the opportunity to care for your patient. Please do not hesitate to contact me should you have any other questions.  Sincerely, Isaac Boxer PA-C [facility] Phone: [phone] Fax: [phone]  02/10/2024, 11:32 AM

## 2024-02-17 ENCOUNTER — Ambulatory Visit (INDEPENDENT_AMBULATORY_CARE_PROVIDER_SITE_OTHER): Payer: Self-pay | Admitting: Audiology

## 2024-02-17 ENCOUNTER — Ambulatory Visit (INDEPENDENT_AMBULATORY_CARE_PROVIDER_SITE_OTHER): Payer: Self-pay | Admitting: Physician Assistant

## 2024-02-17 VITALS — BP 122/79 | HR 74

## 2024-02-17 DIAGNOSIS — H6690 Otitis media, unspecified, unspecified ear: Secondary | ICD-10-CM

## 2024-02-17 DIAGNOSIS — H9042 Sensorineural hearing loss, unilateral, left ear, with unrestricted hearing on the contralateral side: Secondary | ICD-10-CM

## 2024-02-17 DIAGNOSIS — H6991 Unspecified Eustachian tube disorder, right ear: Secondary | ICD-10-CM

## 2024-02-17 DIAGNOSIS — H90A31 Mixed conductive and sensorineural hearing loss, unilateral, right ear with restricted hearing on the contralateral side: Secondary | ICD-10-CM

## 2024-02-17 DIAGNOSIS — H9011 Conductive hearing loss, unilateral, right ear, with unrestricted hearing on the contralateral side: Secondary | ICD-10-CM

## 2024-02-17 DIAGNOSIS — H66001 Acute suppurative otitis media without spontaneous rupture of ear drum, right ear: Secondary | ICD-10-CM

## 2024-02-17 DIAGNOSIS — H9071 Mixed conductive and sensorineural hearing loss, unilateral, right ear, with unrestricted hearing on the contralateral side: Secondary | ICD-10-CM

## 2024-02-17 DIAGNOSIS — H9191 Unspecified hearing loss, right ear: Secondary | ICD-10-CM

## 2024-02-17 NOTE — Progress Notes (Signed)
 Dear Dr. Lydia Sams, Here is my assessment for our mutual patient, Isaac Hubbard. Thank you for allowing me the opportunity to care for your patient. Please do not hesitate to contact me should you have any other questions. Sincerely, Belma Boxer PA-C  Otolaryngology Clinic Note Referring provider: Dr. Lydia Sams HPI:  Isaac Hubbard is a 43 y.o. male kindly referred by Dr. Lydia Sams   The patient is a 43 year old gentleman seen our office for follow-up evaluation status post right sided otitis media.  Patient was last seen in the office on 02/10/2024.  Below is a recap of that encounter.  Update 02/17/2024.  Since last office visit he notes that he has continued to have decreased hearing out of the right ear.  He denies any pain in the ear, no drainage.  No infectious signs or symptoms.  He does note that on 2 separate occasions he yawned heard a popping sound in his right ear and was able to hear for approximately 2 minutes.  He notes that the ear close back up and his hearing went back to decreased on the right.  He denies any left-sided complaints.  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   PMH/Meds/All/SocHx/FamHx/ROS:   Past Medical History:  Diagnosis Date   Hyperlipidemia    Nicotine  dependence 08/31/2015   Prediabetes 08/2015   lifestyle/ dietary change     Past Surgical History:  Procedure Laterality Date   APPENDECTOMY     BIOPSY  08/23/2018   Procedure: BIOPSY;  Surgeon: Suzette Espy, MD;  [facility]: [facility];  Service: Endoscopy;;  (gastric)   ESOPHAGOGASTRODUODENOSCOPY N/A 08/23/2018   Procedure: ESOPHAGOGASTRODUODENOSCOPY (EGD);  Surgeon: Suzette Espy, MD;  [facility]: [facility];  Service: Endoscopy;  Laterality: N/A;    SPINE SURGERY  2015   c spine   TONSILLECTOMY      Family History  Problem Relation Age of Onset   Diabetes Father    Hypertension Father    Arthritis Father    Hypertension Paternal Grandmother      Social Connections: Not on file      Current Outpatient Medications:    acetaminophen  (TYLENOL ) 325 MG tablet, Take 1,000 mg by mouth every 6 (six) hours as needed for headache., Disp: , Rfl:    fluticasone  (FLONASE ) 50 MCG/ACT nasal spray, Place 2 sprays into both nostrils daily., Disp: 16 g, Rfl: 6   loratadine  (CLARITIN ) 10 MG tablet, Take 1 tablet (10 mg total) by mouth daily., Disp: 30 tablet, Rfl: 11   naproxen  (NAPROSYN ) 500 MG tablet, Take 1 tablet (500 mg total) by mouth 2 (two) times daily., Disp: 20 tablet, Rfl: 0   pantoprazole  (PROTONIX ) 40 MG tablet, Take 1 tablet (40 mg total) by mouth daily before breakfast., Disp: 90 tablet, Rfl: 3   promethazine -dextromethorphan (PROMETHAZINE -DM) 6.25-15 MG/5ML syrup, Take 5 mLs by mouth 4 (four) times daily as needed for cough., Disp: 118 mL, Rfl: 0   pseudoephedrine  (SUDAFED) 30 MG tablet, Take 30 mg by mouth every 4 (four) hours as needed for congestion., Disp: , Rfl:    Physical Exam:   BP 122/79   Pulse 74   SpO2 92%   Pertinent Findings  CN II-XII intact Bilateral EAC clear  and TM intact with well pneumatized middle ear spaces Weber 512: equal Rinne 512: AC > BC b/l  No obviously neck masses/lymphadenopathy/thyromegaly No respiratory distress or stridor  Seprately Identifiable Procedures:  None  Impression & Plans:  Isaac Hubbard is a 43 y.o. male with the following   Otitis media-  The patient presents today for follow-up evaluation of otitis media and right-sided hearing loss.  On exam today he has no signs of effusion, he has right sided type C tympanometry.  He denies any pain.  He did have several episodes where his hearing returned back to normal after hearing a popping in his right ear.  His symptoms are most  consistent with eustachian tube dysfunction.  I would recommend he continue using Flonase , daily Claritin , and attempt to pop his ears.  I like to see him back in the office in 2 months with repeat audiogram and office visit.  If he develops any new or worsening signs or symptoms in the meantime I like to see him back sooner.  He verbalized understanding and agreement to this plan had no further questions or concerns.   - f/u 62-month office visit with repeat audiological evaluation   Thank you for allowing me the opportunity to care for your patient. Please do not hesitate to contact me should you have any other questions.  Sincerely, Belma Boxer PA-C [facility] Phone: [phone] Fax: [phone]  02/17/2024, 3:24 PM

## 2024-02-17 NOTE — Progress Notes (Signed)
  [address] [place], [place] [address] [phone]  Audiological Evaluation    Name: Isaac Hubbard     DOB:   1980/12/02      MRN:   [id number]                                                                                     Service Date: 02/17/2024     Accompanied by: unaccompanied   Patient comes today after Lorane Rocker, PA-C sent a referral for a hearing evaluation due to concerns with hearing loss.   Symptoms Yes Details  Hearing loss  [x]  Right hearing loss  Tinnitus  []    Ear pain/ infections/pressure  [x]  Right ear infection, today his ear popped  Balance problems  []    Noise exposure history  []    Previous ear surgeries  []    Family history of hearing loss  []    Amplification  []    Other  []      Otoscopy: Right ear: Abnormal eardrum appearance. Left ear:  Clear external ear canal and notable landmarks visualized on the tympanic membrane.  Tympanometry: Right ear: Type C- Normal external ear canal volume with negative middle ear pressure and normal tympanic membrane compliance. Left ear: Type A- Normal external ear canal volume with normal middle ear pressure and tympanic membrane compliance.   Pure tone Audiometry: Right ear- Normal to profound mixed hearing loss from [phone] Hz.   Left ear-  Normal hearing from [phone], then mild to severe sensorineural hearing loss from  3000 Hz - 8000 Hz.  Speech Audiometry: Right ear- Speech Reception Threshold (SRT) was obtained at 40 dBHL. Left ear-Speech Reception Threshold (SRT) was obtained at 20 dBHL.   Word Recognition Score Tested using NU-6 (recorded) Right ear: 96% was obtained at a presentation level of 80 dBHL with contralateral masking which is deemed as  excellent. Left ear: 96% was obtained at a presentation level of 60 dBHL with contralateral masking which is deemed as  excellent.   The hearing test results were completed under headphones and results are deemed to be of good to fair  reliability. Test technique:  conventional      Recommendations: Follow up with ENT as scheduled for today. Repeat audiogram after medical care.   Demica Zook MARIE LEROUX-MARTINEZ, AUD

## 2024-02-24 ENCOUNTER — Other Ambulatory Visit: Payer: Self-pay | Admitting: Internal Medicine

## 2024-02-24 DIAGNOSIS — K219 Gastro-esophageal reflux disease without esophagitis: Secondary | ICD-10-CM

## 2024-02-28 ENCOUNTER — Other Ambulatory Visit: Payer: Self-pay | Admitting: Internal Medicine

## 2024-02-28 DIAGNOSIS — K219 Gastro-esophageal reflux disease without esophagitis: Secondary | ICD-10-CM

## 2024-03-02 ENCOUNTER — Telehealth: Payer: Self-pay

## 2024-03-02 ENCOUNTER — Other Ambulatory Visit: Payer: Self-pay | Admitting: Internal Medicine

## 2024-03-02 DIAGNOSIS — K219 Gastro-esophageal reflux disease without esophagitis: Secondary | ICD-10-CM

## 2024-03-02 MED ORDER — PANTOPRAZOLE SODIUM 40 MG PO TBEC
40.0000 mg | DELAYED_RELEASE_TABLET | Freq: Every day | ORAL | 0 refills | Status: DC
Start: 2024-03-02 — End: 2024-05-28

## 2024-03-02 NOTE — Telephone Encounter (Signed)
 Spoke to pt, verbalized understanding

## 2024-03-02 NOTE — Telephone Encounter (Signed)
 Copied from CRM [id number]. Topic: Clinical - Medication Question >> Mar 01, 2024  4:08 PM Marissa P wrote: Reason for CRM: Mother called, stating [place] and only comes in on the weekend and cannot come in for a visit and needs the pantoprazole  (PROTONIX ) 40 MG tablet. Cannot due virtual appointment in the middle of the type of work he does as well. Please advise if theres an alternative over the counter that he can take please or another option because he needs this medication please? Callback# [phone]

## 2024-04-20 ENCOUNTER — Ambulatory Visit (INDEPENDENT_AMBULATORY_CARE_PROVIDER_SITE_OTHER): Payer: Self-pay | Admitting: Audiology

## 2024-04-20 ENCOUNTER — Encounter (INDEPENDENT_AMBULATORY_CARE_PROVIDER_SITE_OTHER): Payer: Self-pay | Admitting: Physician Assistant

## 2024-04-20 ENCOUNTER — Ambulatory Visit (INDEPENDENT_AMBULATORY_CARE_PROVIDER_SITE_OTHER): Payer: Self-pay | Admitting: Physician Assistant

## 2024-04-20 VITALS — BP 129/82 | HR 63

## 2024-04-20 DIAGNOSIS — Z09 Encounter for follow-up examination after completed treatment for conditions other than malignant neoplasm: Secondary | ICD-10-CM

## 2024-04-20 DIAGNOSIS — Z8669 Personal history of other diseases of the nervous system and sense organs: Secondary | ICD-10-CM

## 2024-04-20 DIAGNOSIS — H6991 Unspecified Eustachian tube disorder, right ear: Secondary | ICD-10-CM

## 2024-04-20 DIAGNOSIS — H903 Sensorineural hearing loss, bilateral: Secondary | ICD-10-CM

## 2024-04-20 NOTE — Progress Notes (Signed)
 Dear Dr. Tobie, Here is my assessment for our mutual patient, Isaac Hubbard. Thank you for allowing me the opportunity to care for your patient. Please do not hesitate to contact me should you have any other questions. Sincerely, Chyrl Cohen PA-C  Otolaryngology Clinic Note Referring provider: Dr. Tobie HPI:  Isaac Hubbard is a 43 y.o. male kindly referred by Dr. Tobie   The patient is a 43 year old male seen in our office for follow-up evaluation for right-sided otitis media.  The patient was last seen in the office on 02/17/2024.  Below is a recap of that encounter.  The patient is a 43 year old gentleman seen our office for follow-up evaluation status post right sided otitis media.  Patient was last seen in the office on 02/10/2024.  Below is a recap of that encounter.   Update 02/17/2024.  Since last office visit he notes that he has continued to have decreased hearing out of the right ear.  He denies any pain in the ear, no drainage.  No infectious signs or symptoms.  He does note that on 2 separate occasions he yawned heard a popping sound in his right ear and was able to hear for approximately 2 minutes.  He notes that the ear close back up and his hearing went back to decreased on the right.  He denies any left-sided complaints.  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    Update 04/20/2024  The patient notes that 4 days after his last visit on 02/17/2024 the symptoms completely resolved.  He denied any further pain, no pressure, no decreased hearing and is back to his baseline.  He has not been using medication since that time.     Independent Review of Additional Tests or Records:  Audiological evaluation on 04/20/2024     PMH/Meds/All/SocHx/FamHx/ROS:   Past Medical History:  Diagnosis Date   Hyperlipidemia    Nicotine  dependence 08/31/2015   Prediabetes 08/2015   lifestyle/ dietary change     Past Surgical  History:  Procedure Laterality Date   APPENDECTOMY     BIOPSY  08/23/2018   Procedure: BIOPSY;  Surgeon: Shaaron Lamar HERO, MD;  [facility]: [facility];  Service: Endoscopy;;  (gastric)   ESOPHAGOGASTRODUODENOSCOPY N/A 08/23/2018   Procedure: ESOPHAGOGASTRODUODENOSCOPY (EGD);  Surgeon: Shaaron Lamar HERO, MD;  [facility];  Service: Endoscopy;  Laterality: N/A;   SPINE SURGERY  2015   c spine   TONSILLECTOMY      Family History  Problem Relation Age of Onset   Diabetes Father    Hypertension Father    Arthritis Father    Hypertension Paternal Grandmother      Social Connections: Not on file      Current Outpatient Medications:    acetaminophen  (TYLENOL ) 325 MG tablet, Take 1,000 mg by mouth every 6 (six) hours as needed for headache., Disp: , Rfl:    fluticasone  (FLONASE ) 50 MCG/ACT nasal spray, Place 2 sprays into both nostrils daily., Disp: 16 g, Rfl: 6   loratadine  (CLARITIN ) 10 MG tablet, Take 1 tablet (10 mg total) by mouth daily., Disp: 30 tablet, Rfl: 11   naproxen  (NAPROSYN ) 500 MG tablet, Take 1 tablet (500 mg total) by mouth 2 (two) times daily., Disp: 20 tablet, Rfl: 0   pantoprazole  (PROTONIX ) 40 MG tablet, Take 1 tablet (40 mg total) by mouth daily before breakfast., Disp: 90 tablet, Rfl: 0  promethazine -dextromethorphan (PROMETHAZINE -DM) 6.25-15 MG/5ML syrup, Take 5 mLs by mouth 4 (four) times daily as needed for cough., Disp: 118 mL, Rfl: 0   pseudoephedrine  (SUDAFED) 30 MG tablet, Take 30 mg by mouth every 4 (four) hours as needed for congestion., Disp: , Rfl:    Physical Exam:   There were no vitals taken for this visit.  Pertinent Findings  CN II-XII intact Bilateral EAC clear and TM intact with well pneumatized middle ear spaces  No lesions of oral cavity/oropharynx; dentition wnl, no tonsils No obviously palpable neck masses/lymphadenopathy/thyromegaly No respiratory distress or stridor  Seprately Identifiable Procedures:  None  Impression &  Plans:  Isaac Hubbard is a 43 y.o. male with the following   Eustachian tube dysfunction-  Symptoms have completely resolved, his hearing is back to his baseline.  Bilateral type a tympanometry.  At this time no further management required.  He will return to our office if develops any new or worsening signs or symptoms.  The patient verbalized understanding and agreement to this plan.   - f/u prn   Thank you for allowing me the opportunity to care for your patient. Please do not hesitate to contact me should you have any other questions.  Sincerely, Chyrl Cohen PA-C [facility] Phone: [phone] Fax: [phone]  04/20/2024, 12:56 PM

## 2024-04-20 NOTE — Progress Notes (Signed)
  [address] [place], [place] [address] [phone]  Audiological Evaluation    Name: Isaac Hubbard     DOB:   10/11/1980      MRN:   [id number]                                                                                     Service Date: 04/20/2024     Accompanied by: unaccompanied   Patient comes today after Reyes Cohen, PA-C sent a referral for a hearing evaluation after treatment for right Eustachian tube dysfunction.   Symptoms Yes Details  Hearing loss  [x]  Perceives right side is better. 02-17-24: Right ear- Normal to profound mixed hearing loss from [phone] Hz.   Left ear-  Normal hearing from [phone] Hz, then mild to severe sensorineural hearing loss from  3000 Hz - 8000 Hz.  Tinnitus  []    Ear pain/ infections/pressure  []    Balance problems  []    Noise exposure history  [x]  Noise exposure at work  Previous ear surgeries  []    Family history of hearing loss  []    Amplification  []    Other  []      Otoscopy: Right ear: Clear external ear canal and notable landmarks visualized on the tympanic membrane. Left ear:  Clear external ear canal and notable landmarks visualized on the tympanic membrane.  Tympanometry: Right ear: Type A- Normal external ear canal volume with normal middle ear pressure and tympanic membrane compliance. Left ear: Type A- Normal external ear canal volume with normal middle ear pressure and tympanic membrane compliance.  Pure tone Audiometry: Right ear- Normal hearing from [phone] Hz, then moderate sensorineural hearing loss from 6000 Hz - 8000 Hz. Left ear-  Normal hearing from [phone] Hz, then moderate to severe sensorineural hearing loss from 4000 Hz - 8000 Hz.  Speech Audiometry: Right ear- Speech Reception Threshold (SRT) was obtained at 15 dBHL. Left ear-Speech Reception Threshold (SRT) was obtained at 15 dBHL.   Word Recognition Score Tested using NU-6 (recorded) Right ear: 100% was obtained at a presentation level of  75 dBHL with contralateral masking which is deemed as  excellent. Left ear: 96% was obtained at a presentation level of 75 dBHL with contralateral masking which is deemed as  good .   The hearing test results were completed under headphones and re-checked with inserts and results are deemed to be of good reliability. Test technique:  conventional      Recommendations: Follow up with ENT as scheduled for today. Return for a hearing evaluation if concerns with hearing changes arise or per MD recommendation. Use hearing protection when exposed to loud/damaging sounds.    Isaac Hubbard, AUD

## 2024-05-26 ENCOUNTER — Other Ambulatory Visit: Payer: Self-pay | Admitting: Internal Medicine

## 2024-05-26 DIAGNOSIS — K219 Gastro-esophageal reflux disease without esophagitis: Secondary | ICD-10-CM

## 2024-06-01 ENCOUNTER — Ambulatory Visit (INDEPENDENT_AMBULATORY_CARE_PROVIDER_SITE_OTHER): Payer: Self-pay | Admitting: Internal Medicine

## 2024-06-01 ENCOUNTER — Encounter: Payer: Self-pay | Admitting: Internal Medicine

## 2024-06-01 VITALS — BP 135/87 | HR 78 | Ht 71.0 in | Wt 194.4 lb

## 2024-06-01 DIAGNOSIS — M25562 Pain in left knee: Secondary | ICD-10-CM

## 2024-06-01 DIAGNOSIS — G8929 Other chronic pain: Secondary | ICD-10-CM

## 2024-06-01 DIAGNOSIS — M25561 Pain in right knee: Secondary | ICD-10-CM

## 2024-06-01 DIAGNOSIS — E782 Mixed hyperlipidemia: Secondary | ICD-10-CM

## 2024-06-01 DIAGNOSIS — J309 Allergic rhinitis, unspecified: Secondary | ICD-10-CM

## 2024-06-01 DIAGNOSIS — K219 Gastro-esophageal reflux disease without esophagitis: Secondary | ICD-10-CM

## 2024-06-01 DIAGNOSIS — R7303 Prediabetes: Secondary | ICD-10-CM

## 2024-06-01 NOTE — Assessment & Plan Note (Signed)
 Lab Results  Component Value Date   HGBA1C 6.3 (H) 02/18/2023   Had recent oral steroids around the time of last blood tests, which could cause hypoglycemia Advised to follow low-carb diet and perform moderate exercise Check CMP and hemoglobin A1c

## 2024-06-01 NOTE — Progress Notes (Signed)
 Established Patient Office Visit  Subjective:  Patient ID: Isaac Hubbard, male    DOB: 1981-01-25  Age: 43 y.o. MRN: [id number]  CC:  Chief Complaint  Patient presents with   Knee Pain    Reports knee pain and leg pain, thinks from work.    Gastroesophageal Reflux    HPI Nowell A Hubbard is a 43 y.o. male with past medical history of GERD, prediabetes and HLD who presents for f/u of chronic medical conditions.  GERD: He takes pantoprazole  for it.  He denies any nausea, vomiting, dysphagia or odynophagia.   Prediabetes: His HbA1c was 6.3 in the last visit.  He has tried to improve diet diet since the last visit.  He currently denies any polyuria or polyphagia.  He has history of HLD.  He complains of bilateral knee pain, especially after sitting for a long time at his workplace.  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  Denies any swelling or redness of the knee.  Denies any recent fall or injury.   Past Medical History:  Diagnosis Date   Hyperlipidemia    Nicotine  dependence 08/31/2015   Prediabetes 08/2015   lifestyle/ dietary change    Past Surgical History:  Procedure Laterality Date   APPENDECTOMY     BIOPSY  08/23/2018   Procedure: BIOPSY;  Surgeon: Shaaron Lamar HERO, MD;  [facility];  Service: Endoscopy;;  (gastric)   ESOPHAGOGASTRODUODENOSCOPY N/A 08/23/2018   Procedure: ESOPHAGOGASTRODUODENOSCOPY (EGD);  Surgeon: Shaaron Lamar HERO, MD;  [facility];  Service: Endoscopy;  Laterality: N/A;   SPINE SURGERY  2015   c spine   TONSILLECTOMY      Family History  Problem Relation Age of Onset   Diabetes Father    Hypertension Father    Arthritis Father    Hypertension Paternal Grandmother     Social History   Socioeconomic History   Marital status: Single    Spouse name: Not on file   Number of children: Not on file   Years of education: Not on file   Highest education level: Not on file  Occupational History   Not on file  Tobacco Use    Smoking status: Former    Current packs/day: 0.00    Types: Cigarettes    Quit date: 2020    Years since quitting: 5.7   Smokeless tobacco: Never  Vaping Use   Vaping status: Never Used  Substance and Sexual Activity   Alcohol use: No   Drug use: No   Sexual activity: Yes  Other Topics Concern   Not on file  Social History Narrative   Not on file   Social Drivers of Health   Financial Resource Strain: Not on file  Food Insecurity: Not on file  Transportation Needs: Not on file  Physical Activity: Not on file  Stress: Not on file  Social Connections: Not on file  Intimate Partner Violence: Not on file    Outpatient Medications Prior to Visit  Medication Sig Dispense Refill   acetaminophen  (TYLENOL ) 325 MG tablet Take 1,000 mg by mouth every 6 (six) hours as needed for headache.     fluticasone  (FLONASE ) 50 MCG/ACT nasal spray Place 2 sprays into both nostrils daily. 16 g 6   loratadine  (CLARITIN ) 10 MG tablet Take 1 tablet (10 mg total) by mouth daily. 30 tablet 11   pantoprazole  (PROTONIX ) 40 MG tablet TAKE 1 TABLET BY MOUTH ONCE DAILY BEFORE BREAKFAST 90 tablet 0  pseudoephedrine  (SUDAFED) 30 MG tablet Take 30 mg by mouth every 4 (four) hours as needed for congestion.     naproxen  (NAPROSYN ) 500 MG tablet Take 1 tablet (500 mg total) by mouth 2 (two) times daily. 20 tablet 0   promethazine -dextromethorphan (PROMETHAZINE -DM) 6.25-15 MG/5ML syrup Take 5 mLs by mouth 4 (four) times daily as needed for cough. 118 mL 0   No facility-administered medications prior to visit.    No Known Allergies  ROS Review of Systems  Constitutional:  Negative for chills and fever.  HENT:  Negative for congestion and sore throat.   Eyes:  Negative for pain and discharge.  Respiratory:  Negative for cough and shortness of breath.   Cardiovascular:  Negative for chest pain and palpitations.  Gastrointestinal:  Negative for diarrhea, nausea and vomiting.  Endocrine: Negative for  polydipsia and polyuria.  Genitourinary:  Negative for dysuria and hematuria.  Musculoskeletal:  Positive for arthralgias (Bilateral knee). Negative for neck pain and neck stiffness.  Skin:  Negative for rash.  Neurological:  Negative for dizziness, weakness, numbness and headaches.  Psychiatric/Behavioral:  Negative for agitation and behavioral problems.       Objective:    Physical Exam Vitals reviewed.  Constitutional:      General: He is not in acute distress.    Appearance: He is not diaphoretic.  HENT:     Head: Normocephalic and atraumatic.     Nose: Nose normal.     Mouth/Throat:     Mouth: Mucous membranes are moist.  Eyes:     General: No scleral icterus.    Extraocular Movements: Extraocular movements intact.  Cardiovascular:     Rate and Rhythm: Normal rate and regular rhythm.     Heart sounds: Normal heart sounds. No murmur heard. Pulmonary:     Breath sounds: Normal breath sounds. No wheezing or rales.  Abdominal:     Palpations: Abdomen is soft.     Tenderness: There is no abdominal tenderness.  Musculoskeletal:     Cervical back: Neck supple. No tenderness.     Right knee: Normal range of motion. No tenderness.     Left knee: Normal range of motion. No tenderness.     Right lower leg: No edema.     Left lower leg: No edema.  Skin:    General: Skin is warm.     Findings: No rash.  Neurological:     General: No focal deficit present.     Mental Status: He is alert and oriented to person, place, and time.     Sensory: No sensory deficit.     Motor: No weakness.  Psychiatric:        Mood and Affect: Mood normal.        Behavior: Behavior normal.     BP 135/87   Pulse 78   Ht 5' 11 (1.803 m)   Wt 194 lb 6.4 oz (88.2 kg)   SpO2 97%   BMI 27.11 kg/m  Wt Readings from Last 3 Encounters:  06/01/24 194 lb 6.4 oz (88.2 kg)  02/10/24 185 lb (83.9 kg)  10/15/23 185 lb (83.9 kg)    Lab Results  Component Value Date   TSH 1.416 08/23/2015   Lab  Results  Component Value Date   WBC 5.7 02/18/2023   HGB 14.5 02/18/2023   HCT 41.5 02/18/2023   MCV 91 02/18/2023   PLT 182 02/18/2023   Lab Results  Component Value Date   NA 140 02/18/2023  K 4.3 02/18/2023   CO2 23 02/18/2023   GLUCOSE 98 02/18/2023   BUN 18 02/18/2023   CREATININE 0.95 02/18/2023   BILITOT 0.2 02/18/2023   ALKPHOS 68 02/18/2023   AST 27 02/18/2023   ALT 41 02/18/2023   PROT 7.3 02/18/2023   ALBUMIN 4.9 02/18/2023   CALCIUM 9.9 02/18/2023   ANIONGAP 7 01/01/2022   EGFR 102 02/18/2023   Lab Results  Component Value Date   CHOL 206 (H) 02/18/2023   Lab Results  Component Value Date   HDL 38 (L) 02/18/2023   Lab Results  Component Value Date   LDLCALC 118 (H) 02/18/2023   Lab Results  Component Value Date   TRIG 284 (H) 02/18/2023   Lab Results  Component Value Date   CHOLHDL 5.4 (H) 02/18/2023   Lab Results  Component Value Date   HGBA1C 6.3 (H) 02/18/2023      Assessment & Plan:   Problem List Items Addressed This Visit       Respiratory   Allergic rhinitis   Has been evaluated by ENT specialist due to chronic nasal congestion and ear pain, now improved Uses Flonase  for nasal congestion/allergy Continue Claritin         Digestive   Gastroesophageal reflux disease - Primary   Well controlled with pantoprazole , refilled        Other   Prediabetes   Lab Results  Component Value Date   HGBA1C 6.3 (H) 02/18/2023   Had recent oral steroids around the time of last blood tests, which could cause hypoglycemia Advised to follow low-carb diet and perform moderate exercise Check CMP and hemoglobin A1c      Relevant Orders   CMP14+EGFR   Hemoglobin A1c   Hyperlipidemia   Check lipid profile Advised to follow low-cholesterol diet for now      Relevant Orders   CMP14+EGFR   Lipid panel   Bilateral chronic knee pain   Could be due to stiffness of the muscles versus patellofemoral syndrome Advised to avoid prolonged  sitting in the same position Simple knee exercises material provided Tylenol  as needed for pain, advised to avoid oral NSAIDs for now due to history of GERD       Prefers to wait for Tdap vaccine for now due to lack of insurance.   No orders of the defined types were placed in this encounter.   Follow-up: Return in about 1 year (around 06/01/2025) for Prediabetes and HLD.    Suzzane MARLA Blanch, MD

## 2024-06-01 NOTE — Assessment & Plan Note (Signed)
Well controlled with pantoprazole, refilled 

## 2024-06-01 NOTE — Assessment & Plan Note (Signed)
Check lipid profile Advised to follow low cholesterol diet for now 

## 2024-06-01 NOTE — Assessment & Plan Note (Signed)
 Has been evaluated by ENT specialist due to chronic nasal congestion and ear pain, now improved Uses Flonase  for nasal congestion/allergy Continue Claritin 

## 2024-06-01 NOTE — Patient Instructions (Signed)
Please continue to take medications as prescribed.  Please continue to follow low carb diet and perform moderate exercise/walking at least 150 mins/week.  Please get fasting blood tests done within a week.  

## 2024-06-01 NOTE — Assessment & Plan Note (Signed)
 Could be due to stiffness of the muscles versus patellofemoral syndrome Advised to avoid prolonged sitting in the same position Simple knee exercises material provided Tylenol  as needed for pain, advised to avoid oral NSAIDs for now due to history of GERD

## 2024-07-10 ENCOUNTER — Ambulatory Visit
Admission: RE | Admit: 2024-07-10 | Discharge: 2024-07-10 | Disposition: A | Discharge status: Home / Self Care | Attending: Family Medicine | Admitting: Family Medicine

## 2024-07-10 VITALS — BP 125/82 | HR 97 | Temp 98.2°F | Resp 16

## 2024-07-10 DIAGNOSIS — J3089 Other allergic rhinitis: Secondary | ICD-10-CM | POA: Diagnosis not present

## 2024-07-10 DIAGNOSIS — R196 Halitosis: Secondary | ICD-10-CM | POA: Diagnosis not present

## 2024-07-10 MED ORDER — FLUTICASONE PROPIONATE 50 MCG/ACT NA SUSP: 1.0000 | Freq: Two times a day (BID) | NASAL | 2 refills | Status: AC

## 2024-07-10 MED ORDER — AZELASTINE HCL 0.1 % NA SOLN: 1.0000 | Freq: Two times a day (BID) | NASAL | 0 refills | Status: AC

## 2024-07-10 NOTE — ED Provider Notes (Signed)
 [facility]    CSN: [id number] Arrival date & time: 07/10/24  1414      History   Chief Complaint Chief Complaint  Patient presents with   Nasal Congestion    Headache, drainage, loss of voice. - Entered by patient    HPI Isaac Hubbard is a 43 y.o. male.   Patient resenting today with about 5-day history of hoarse voice, postnasal drainage, nasal congestion, ear pressure.  Denies fever, chills, body aches, chest pain, shortness of breath, abdominal pain, vomiting, diarrhea.  States his symptoms are significantly worse first thing in the morning after waking up and cleared up after a shower.  Takes Claritin  and Sudafed daily and has been taking DayQuil and NyQuil as needed since symptoms started with good relief.  History of seasonal allergies.  Also complaining of partner stating for the past few months he has had an issue with bad breath despite excellent oral hygiene habits.  He states he is a previous smoker but has not smoked in several years.  He denies dental pain or known issues, sores or lesions in the mouth, new medications or supplements.    Past Medical History:  Diagnosis Date   Hyperlipidemia    Nicotine  dependence 08/31/2015   Prediabetes 08/2015   lifestyle/ dietary change    Patient Active Problem List   Diagnosis Date Noted   Bilateral chronic knee pain 06/01/2024   Tendinitis of right shoulder 02/18/2023   Gastroesophageal reflux disease 02/05/2022   Prediabetes 08/31/2015   Hyperlipidemia 08/31/2015   Herniated disc, cervical 02/26/2014   Encounter for general adult medical examination with abnormal findings 11/20/2012   Allergic rhinitis 09/21/2011   Overweight 09/20/2008    Past Surgical History:  Procedure Laterality Date   APPENDECTOMY     BIOPSY  08/23/2018   Procedure: BIOPSY;  Surgeon: Shaaron Lamar HERO, MD;  Location: [facility];  Service: Endoscopy;;  (gastric)   ESOPHAGOGASTRODUODENOSCOPY N/A 08/23/2018   Procedure:  ESOPHAGOGASTRODUODENOSCOPY (EGD);  Surgeon: Shaaron Lamar HERO, MD;  Location: [facility];  Service: Endoscopy;  Laterality: N/A;   SPINE SURGERY  2015   c spine   TONSILLECTOMY         Home Medications    Prior to Admission medications   Medication Sig Start Date End Date Taking? Authorizing Provider  azelastine (ASTELIN) 0.1 % nasal spray Place 1 spray into both nostrils 2 (two) times daily. Use in each nostril as directed 07/10/24  Yes Stuart Vernell Norris, PA-C  fluticasone  (FLONASE ) 50 MCG/ACT nasal spray Place 1 spray into both nostrils 2 (two) times daily. 07/10/24  Yes Stuart Vernell Norris, PA-C  acetaminophen  (TYLENOL ) 325 MG tablet Take 1,000 mg by mouth every 6 (six) hours as needed for headache.    [provider]  fluticasone  (FLONASE ) 50 MCG/ACT nasal spray Place 2 sprays into both nostrils daily. 02/10/24   Hedges, Reyes, PA-C  loratadine  (CLARITIN ) 10 MG tablet Take 1 tablet (10 mg total) by mouth daily. 02/10/24   Hedges, Reyes, PA-C  pantoprazole  (PROTONIX ) 40 MG tablet TAKE 1 TABLET BY MOUTH ONCE DAILY BEFORE BREAKFAST 05/28/24   Tobie Suzzane POUR, MD  pseudoephedrine  (SUDAFED) 30 MG tablet Take 30 mg by mouth every 4 (four) hours as needed for congestion.    [provider]    Family History Family History  Problem Relation Age of Onset   Diabetes Father    Hypertension Father    Arthritis Father    Hypertension Paternal Grandmother  Social History Social History   Tobacco Use   Smoking status: Former    Current packs/day: 0.00    Types: Cigarettes    Quit date: 2020    Years since quitting: 5.8   Smokeless tobacco: Never  Vaping Use   Vaping status: Never Used  Substance Use Topics   Alcohol use: No   Drug use: No     Allergies   Patient has no known allergies.   Review of Systems Review of Systems Per HPI  Physical Exam Triage Vital Signs ED Triage Vitals  Encounter Vitals Group     BP 07/10/24 1441 125/82      Girls Systolic BP Percentile --      Girls Diastolic BP Percentile --      Boys Systolic BP Percentile --      Boys Diastolic BP Percentile --      Pulse Rate 07/10/24 1441 97     Resp 07/10/24 1441 16     Temp 07/10/24 1441 98.2 F (36.8 C)     Temp Source 07/10/24 1441 Oral     SpO2 07/10/24 1441 97 %     Weight --      Height --      Head Circumference --      Peak Flow --      Pain Score 07/10/24 1445 0     Pain Loc --      Pain Education --      Exclude from Growth Chart --    No data found.  Updated Vital Signs BP 125/82 (BP Location: Right Arm)   Pulse 97   Temp 98.2 F (36.8 C) (Oral)   Resp 16   SpO2 97%   Visual Acuity Right Eye Distance:   Left Eye Distance:   Bilateral Distance:    Right Eye Near:   Left Eye Near:    Bilateral Near:     Physical Exam Vitals and nursing note reviewed.  Constitutional:      Appearance: Normal appearance.  HENT:     Head: Atraumatic.     Right Ear: Tympanic membrane normal.     Left Ear: Tympanic membrane normal.     Nose: Rhinorrhea present.     Mouth/Throat:     Mouth: Mucous membranes are moist.     Pharynx: Posterior oropharyngeal erythema present. No oropharyngeal exudate.     Comments: Cobblestoning posterior oropharynx Eyes:     Extraocular Movements: Extraocular movements intact.     Conjunctiva/sclera: Conjunctivae normal.  Cardiovascular:     Rate and Rhythm: Normal rate.     Heart sounds: Normal heart sounds.  Pulmonary:     Effort: Pulmonary effort is normal.  Musculoskeletal:        General: Normal range of motion.     Cervical back: Normal range of motion and neck supple.  Skin:    General: Skin is warm and dry.  Neurological:     Mental Status: He is oriented to person, place, and time.  Psychiatric:        Mood and Affect: Mood normal.        Thought Content: Thought content normal.        Judgment: Judgment normal.      UC Treatments / Results  Labs (all labs ordered are listed, but  only abnormal results are displayed) Labs Reviewed - No data to display  EKG   Radiology No results found.  Procedures Procedures (including critical care time)  Medications  Ordered in UC Medications - No data to display  Initial Impression / Assessment and Plan / UC Course  I have reviewed the triage vital signs and the nursing notes.  Pertinent labs & imaging results that were available during my care of the patient were reviewed by me and considered in my medical decision making (see chart for details).     Vitals and exam very reassuring today, not currently symptomatic symptoms mostly occurring first thing in the morning.  Suspect seasonal allergy exacerbation.  Treat with antihistamines once to twice daily, Flonase  and Astelin nasal sprays, decongestants as needed, saline sinus rinses, humidifiers.  Regarding the bad breath, we will see if improving sinus drainage improves the issue.  Continue good oral hygiene, follow-up with dentist if not resolving.  Final Clinical Impressions(s) / UC Diagnoses   Final diagnoses:  Seasonal allergic rhinitis due to other allergic trigger  Halitosis     Discharge Instructions      Increase your antihistamine to twice daily while significantly symptomatic, take Astelin and Flonase  nasal sprays twice daily, and may use decongestants as needed for symptomatic relief.  Saline sinus rinses, humidifiers, vapor rubs as needed additionally.    ED Prescriptions     Medication Sig Dispense Auth. Provider   azelastine (ASTELIN) 0.1 % nasal spray Place 1 spray into both nostrils 2 (two) times daily. Use in each nostril as directed 30 mL Stuart Vernell Norris, PA-C   fluticasone  (FLONASE ) 50 MCG/ACT nasal spray Place 1 spray into both nostrils 2 (two) times daily. 16 g Stuart Vernell Norris, [place]      PDMP not reviewed this encounter.   Stuart Vernell Norris, [place] 07/10/24 1542

## 2024-07-10 NOTE — Discharge Instructions (Addendum)
 Increase your antihistamine to twice daily while significantly symptomatic, take Astelin and Flonase  nasal sprays twice daily, and may use decongestants as needed for symptomatic relief.  Saline sinus rinses, humidifiers, vapor rubs as needed additionally.

## 2024-07-10 NOTE — ED Triage Notes (Signed)
 Pt reports he lost his voice after cutting the grass and throat discomfort, nasal congestion in the morning x 5 days      Took Claritin  and sudafed

## 2024-08-24 ENCOUNTER — Other Ambulatory Visit: Payer: Self-pay | Admitting: Internal Medicine

## 2024-08-24 DIAGNOSIS — K219 Gastro-esophageal reflux disease without esophagitis: Secondary | ICD-10-CM
